# Patient Record
Sex: Male | Born: 1940 | Race: White | Hispanic: No | Marital: Married | Smoking: Former smoker
Health system: Southern US, Community
[De-identification: ages and names within clinical notes are randomized; demographics above are authoritative.]

## PROBLEM LIST (undated history)

## (undated) DIAGNOSIS — E119 Type 2 diabetes mellitus without complications: Secondary | ICD-10-CM

## (undated) DIAGNOSIS — F419 Anxiety disorder, unspecified: Secondary | ICD-10-CM

## (undated) DIAGNOSIS — E049 Nontoxic goiter, unspecified: Secondary | ICD-10-CM

## (undated) DIAGNOSIS — G8929 Other chronic pain: Secondary | ICD-10-CM

## (undated) DIAGNOSIS — R05 Cough: Secondary | ICD-10-CM

## (undated) DIAGNOSIS — I10 Essential (primary) hypertension: Secondary | ICD-10-CM

## (undated) DIAGNOSIS — M1990 Unspecified osteoarthritis, unspecified site: Secondary | ICD-10-CM

## (undated) DIAGNOSIS — G4730 Sleep apnea, unspecified: Secondary | ICD-10-CM

## (undated) DIAGNOSIS — N400 Enlarged prostate without lower urinary tract symptoms: Secondary | ICD-10-CM

## (undated) DIAGNOSIS — K219 Gastro-esophageal reflux disease without esophagitis: Secondary | ICD-10-CM

## (undated) DIAGNOSIS — E785 Hyperlipidemia, unspecified: Secondary | ICD-10-CM

## (undated) DIAGNOSIS — M549 Dorsalgia, unspecified: Secondary | ICD-10-CM

## (undated) DIAGNOSIS — H269 Unspecified cataract: Secondary | ICD-10-CM

## (undated) DIAGNOSIS — E039 Hypothyroidism, unspecified: Secondary | ICD-10-CM

## (undated) DIAGNOSIS — R059 Cough, unspecified: Secondary | ICD-10-CM

## (undated) HISTORY — PX: CARDIOVASCULAR STRESS TEST: SHX262

## (undated) HISTORY — PX: EYE SURGERY: SHX253

## (undated) HISTORY — PX: HERNIA REPAIR: SHX51

## (undated) HISTORY — DX: Cough: R05

## (undated) HISTORY — DX: Nontoxic goiter, unspecified: E04.9

## (undated) HISTORY — DX: Cough, unspecified: R05.9

---

## 1983-05-24 HISTORY — PX: CHOLECYSTECTOMY: SHX55

## 2001-05-23 HISTORY — PX: PROSTATE SURGERY: SHX751

## 2004-05-23 HISTORY — PX: ROTATOR CUFF REPAIR: SHX139

## 2012-06-18 ENCOUNTER — Other Ambulatory Visit: Payer: Self-pay | Admitting: Neurosurgery

## 2012-06-19 ENCOUNTER — Encounter (HOSPITAL_COMMUNITY): Payer: Self-pay | Admitting: Pharmacist

## 2012-06-22 ENCOUNTER — Ambulatory Visit (HOSPITAL_COMMUNITY)
Admission: RE | Admit: 2012-06-22 | Discharge: 2012-06-22 | Disposition: A | Discharge status: Home / Self Care | Payer: Medicare Other | Source: Ambulatory Visit | Attending: Anesthesiology | Admitting: Anesthesiology

## 2012-06-22 ENCOUNTER — Encounter (HOSPITAL_COMMUNITY): Payer: Self-pay | Admitting: Vascular Surgery

## 2012-06-22 ENCOUNTER — Encounter (HOSPITAL_COMMUNITY): Payer: Self-pay

## 2012-06-22 ENCOUNTER — Encounter (HOSPITAL_COMMUNITY)
Admission: RE | Admit: 2012-06-22 | Discharge: 2012-06-22 | Disposition: A | Discharge status: Home / Self Care | Payer: Medicare Other | Source: Ambulatory Visit | Attending: Neurosurgery | Admitting: Neurosurgery

## 2012-06-22 DIAGNOSIS — Z01810 Encounter for preprocedural cardiovascular examination: Secondary | ICD-10-CM | POA: Insufficient documentation

## 2012-06-22 DIAGNOSIS — Z01812 Encounter for preprocedural laboratory examination: Secondary | ICD-10-CM | POA: Insufficient documentation

## 2012-06-22 HISTORY — DX: Dorsalgia, unspecified: M54.9

## 2012-06-22 HISTORY — DX: Gastro-esophageal reflux disease without esophagitis: K21.9

## 2012-06-22 HISTORY — DX: Unspecified osteoarthritis, unspecified site: M19.90

## 2012-06-22 HISTORY — DX: Type 2 diabetes mellitus without complications: E11.9

## 2012-06-22 HISTORY — DX: Unspecified cataract: H26.9

## 2012-06-22 HISTORY — DX: Essential (primary) hypertension: I10

## 2012-06-22 HISTORY — DX: Sleep apnea, unspecified: G47.30

## 2012-06-22 HISTORY — DX: Benign prostatic hyperplasia without lower urinary tract symptoms: N40.0

## 2012-06-22 HISTORY — DX: Hyperlipidemia, unspecified: E78.5

## 2012-06-22 HISTORY — DX: Other chronic pain: G89.29

## 2012-06-22 LAB — CBC
HCT: 37.4 % — ABNORMAL LOW (ref 39.0–52.0)
Hemoglobin: 13 g/dL (ref 13.0–17.0)
MCH: 29.7 pg (ref 26.0–34.0)
MCHC: 34.8 g/dL (ref 30.0–36.0)
MCV: 85.6 fL (ref 78.0–100.0)
RDW: 12.4 % (ref 11.5–15.5)

## 2012-06-22 LAB — BASIC METABOLIC PANEL
BUN: 25 mg/dL — ABNORMAL HIGH (ref 6–23)
CO2: 28 mEq/L (ref 19–32)
Chloride: 104 mEq/L (ref 96–112)
Creatinine, Ser: 1.32 mg/dL (ref 0.50–1.35)
Glucose, Bld: 99 mg/dL (ref 70–99)
Potassium: 4.1 mEq/L (ref 3.5–5.1)

## 2012-06-22 LAB — SURGICAL PCR SCREEN: Staphylococcus aureus: NEGATIVE

## 2012-06-22 NOTE — Consult Note (Signed)
Anesthesia Note:  06/22/12 CXR report noted and reviewed with Anesthesiologist Dr. Katrinka Blazing.  Recommend further evaluation pre-operatively.  Darl Pikes at [facility] had Dr. Lovell Sheehan review.  Plan to cancel surgery until paratracheal mass versus goiter can be further evaluated.  Darl Pikes to contact patient and arrange plans for referral.    Shonna Chock, PA-C 06/22/12 1914

## 2012-06-22 NOTE — Pre-Procedure Instructions (Signed)
Edgar Chang  06/22/2012   Your procedure is scheduled on:  Monday June 25, 2012  Report to [facility] at 9:00 AM.  Call this number if you have problems the morning of surgery: [phone]   Remember:   Do not eat food or drink liquids after midnight.   Take these medicines the morning of surgery with A SIP OF WATER: omeprazole, flomax   Do not wear jewelry, make-up or nail polish.  Do not wear lotions, powders, or perfumes.  Do not shave 48 hours prior to surgery. Men may shave face and neck.  Do not bring valuables to the hospital.  Contacts, dentures or bridgework may not be worn into surgery.  Leave suitcase in the car. After surgery it may be brought to your room.  For patients admitted to the hospital, checkout time is 11:00 AM the day of  discharge.   Patients discharged the day of surgery will not be allowed to drive  home.  Name and phone number of your driver: family  / friend  Special Instructions: Shower using CHG 2 nights before surgery and the night before surgery.  If you shower the day of surgery use CHG.  Use special wash - you have one bottle of CHG for all showers.  You should use approximately 1/3 of the bottle for each shower.   Please read over the following fact sheets that you were given: Pain Booklet, Coughing and Deep Breathing, MRSA Information and Surgical Site Infection Prevention

## 2012-06-22 NOTE — Progress Notes (Signed)
Forwarded abnormal chest xray to anesthesia for review.

## 2012-06-25 ENCOUNTER — Inpatient Hospital Stay (HOSPITAL_COMMUNITY): Admission: RE | Admit: 2012-06-25 | Payer: Medicare Other | Source: Ambulatory Visit | Admitting: Neurosurgery

## 2012-06-25 SURGERY — LUMBAR LAMINECTOMY/DECOMPRESSION MICRODISCECTOMY 1 LEVEL
Anesthesia: General | Laterality: Left

## 2012-07-09 ENCOUNTER — Encounter (INDEPENDENT_AMBULATORY_CARE_PROVIDER_SITE_OTHER): Payer: Self-pay

## 2012-07-10 ENCOUNTER — Ambulatory Visit (INDEPENDENT_AMBULATORY_CARE_PROVIDER_SITE_OTHER): Payer: Medicare Other | Admitting: Surgery

## 2012-07-10 ENCOUNTER — Encounter (INDEPENDENT_AMBULATORY_CARE_PROVIDER_SITE_OTHER): Payer: Self-pay | Admitting: Surgery

## 2012-07-10 VITALS — BP 122/70 | HR 74 | Temp 98.1°F | Resp 18 | Ht 71.0 in | Wt 206.0 lb

## 2012-07-10 DIAGNOSIS — E049 Nontoxic goiter, unspecified: Secondary | ICD-10-CM

## 2012-07-10 NOTE — Patient Instructions (Signed)

## 2012-07-10 NOTE — Progress Notes (Signed)
General Surgery [facility]  Chief Complaint  Patient presents with  . New Evaluation    sub-sternal goiter with tracheal deviation - referral from Dr. 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 Patient had originally been diagnosed with thyroid goiter approximately 30 years ago. He underwent needle biopsy which by report was benign. Patient elected not to have surgical resection and has been relatively asymptomatic.  Patient recently has developed symptomatic lumbar disc disease. Preoperative evaluation included a chest x-ray showing tracheal deviation. Subsequent CT scan of the chest shows a bilobed enlarged mass in the right anterior mediastinum consistent with substernal goiter.  The largest component measures 6.2 cm in diameter. There is tracheal deviation to the left. There are calcifications in both the right and left thyroid lobes. Biopsy was recommended by the radiologist.  Patient has never been on thyroid medication. He has had no prior neck surgery. There is no family history of thyroid disease. There is no history of other endocrinopathy.  Past Medical History  Diagnosis Date  . Hypertension     sees Dr. Arma Heading  . Hyperlipidemia   . Sleep apnea     uses cpap occass.  . Diabetes mellitus without complication   . Benign prostate hyperplasia   . GERD (gastroesophageal reflux disease)   . Arthritis   . Cataract     left eye  . Chronic back pain   . Cough   . Goiter      Current Outpatient Prescriptions  Medication Sig Dispense Refill  . aspirin EC 81 MG tablet Take 81 mg by mouth daily.      Marland Kitchen glimepiride (AMARYL) 4 MG tablet Take 4 mg by mouth daily before breakfast.      . hydrochlorothiazide (HYDRODIURIL) 25 MG tablet Take 25 mg by mouth 2 (two) times daily.      Marland Kitchen ibuprofen (ADVIL,MOTRIN) 200 MG tablet Take 600 mg by mouth 2  (two) times daily as needed. For pain      . lisinopril (PRINIVIL,ZESTRIL) 20 MG tablet Take 20 mg by mouth daily.      . metFORMIN (GLUCOPHAGE) 500 MG tablet Take 500 mg by mouth 2 (two) times daily with a meal.      . Multiple Vitamin (MULTIVITAMIN WITH MINERALS) TABS Take 1 tablet by mouth daily.      Marland Kitchen omeprazole (PRILOSEC) 20 MG capsule Take 20 mg by mouth 2 (two) times daily.      . simvastatin (ZOCOR) 20 MG tablet Take 20 mg by mouth every evening.      . Tamsulosin HCl (FLOMAX) 0.4 MG CAPS Take 0.4 mg by mouth daily.       No current facility-administered medications for this visit.     No Known Allergies   Family History  Problem Relation Age of Onset  . Heart disease Mother      History   Social History  . Marital Status: Married    Spouse Name: N/A    Number of Children: N/A  . Years of Education: N/A   Social History Main Topics  . Smoking status: Former Smoker    Quit date: 07/10/1972  . Smokeless tobacco: None     Comment: stopped 20 years ago  . Alcohol Use: No  . Drug Use: No  . Sexually Active: None   Other Topics Concern  . None   Social History Narrative  .  None     REVIEW OF SYSTEMS - PERTINENT POSITIVES ONLY: Denies tremor. Denies palpitations. Patient does note chronic cough especially with prolonged talking. He notes occasional hoarseness. He denies dysphagia. He denies pain.  EXAM: Filed Vitals:   07/10/12 1100  BP: 122/70  Pulse: 74  Temp: 98.1 F (36.7 C)  Resp: 18    HEENT: normocephalic; pupils equal and reactive; sclerae clear; dentition good; mucous membranes moist NECK:  Palpation shows a slightly irregular left thyroid lobe without dominant or discrete mass; on the right, with the neck extended, and a swallowing maneuver, the top of the mass is palpable above the level of the clavicle; symmetric on extension; no palpable anterior or posterior cervical lymphadenopathy; no supraclavicular masses; no tenderness CHEST: clear to  auscultation bilaterally without rales, rhonchi, or wheezes CARDIAC: regular rate and rhythm without significant murmur; peripheral pulses are full EXT:  non-tender without edema; no deformity NEURO: no gross focal deficits; no sign of tremor   LABORATORY RESULTS: See [facility] (CHL-Epic) for most recent results   RADIOLOGY RESULTS: See [facility] (CHL-Epic) for most recent results   IMPRESSION: #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 We discussed further evaluation. I have recommended total thyroidectomy through a cervical incision. Risk and benefits of the procedure were discussed at length. Primary risk would be 2 recurrent laryngeal nerves. I explained to him that there was approximately a 5% chance of postoperative voice changes and hoarseness. I am also concerned about the size of the gland, itself sternal location, and the calcifications. There is a probable risk of malignancy of about 15%.  After discussion the patient and his wife have decided to proceed with thyroidectomy. We will make arrangements for surgery in the near future.  The risks and benefits of the procedure have been discussed at length with the patient.  The patient understands the proposed procedure, potential alternative treatments, and the course of recovery to be expected.  All of the patient's questions have been answered at this time.  The patient wishes to proceed with surgery.  Velora Heckler, MD, FACS General & Endocrine Surgery [facility]   Visit Diagnoses: 1. Substernal thyroid goiter     Primary Care Physician: No primary provider on file.

## 2012-07-11 ENCOUNTER — Encounter (HOSPITAL_COMMUNITY): Payer: Self-pay | Admitting: Pharmacy Technician

## 2012-07-13 ENCOUNTER — Encounter (HOSPITAL_COMMUNITY)
Admission: RE | Admit: 2012-07-13 | Discharge: 2012-07-13 | Disposition: A | Discharge status: Home / Self Care | Payer: Medicare Other | Source: Ambulatory Visit | Attending: Surgery | Admitting: Surgery

## 2012-07-13 ENCOUNTER — Encounter (HOSPITAL_COMMUNITY): Payer: Self-pay

## 2012-07-13 ENCOUNTER — Ambulatory Visit (HOSPITAL_COMMUNITY)
Admission: RE | Admit: 2012-07-13 | Discharge: 2012-07-13 | Disposition: A | Discharge status: Home / Self Care | Payer: Medicare Other | Source: Ambulatory Visit | Attending: Anesthesiology | Admitting: Anesthesiology

## 2012-07-13 DIAGNOSIS — Z01818 Encounter for other preprocedural examination: Secondary | ICD-10-CM | POA: Insufficient documentation

## 2012-07-13 DIAGNOSIS — E049 Nontoxic goiter, unspecified: Secondary | ICD-10-CM | POA: Insufficient documentation

## 2012-07-13 DIAGNOSIS — Z01812 Encounter for preprocedural laboratory examination: Secondary | ICD-10-CM | POA: Insufficient documentation

## 2012-07-13 LAB — SURGICAL PCR SCREEN
MRSA, PCR: NEGATIVE
Staphylococcus aureus: NEGATIVE

## 2012-07-13 LAB — BASIC METABOLIC PANEL
GFR calc Af Amer: 78 mL/min — ABNORMAL LOW (ref >90)
GFR calc non Af Amer: 67 mL/min — ABNORMAL LOW (ref >90)
Potassium: 4 mEq/L (ref 3.5–5.1)
Sodium: 139 mEq/L (ref 135–145)

## 2012-07-13 LAB — CBC
MCHC: 34.3 g/dL (ref 30.0–36.0)
Platelets: 237 10*3/uL (ref 150–400)
RDW: 12.1 % (ref 11.5–15.5)
WBC: 5.3 10*3/uL (ref 4.0–10.5)

## 2012-07-13 NOTE — Progress Notes (Signed)
Quick Note:  These results are acceptable for scheduled surgery.  Edgar Woon M. Hadlie Gipson, MD, FACS [facility] Office: [phone]   ______ 

## 2012-07-13 NOTE — Progress Notes (Signed)
Dr Rica Mast made aware of neck Xray results.  Report taken to Dr Rica Mast.  No new orders given.  Dr Rica Mast noted images in [facility].

## 2012-07-13 NOTE — Patient Instructions (Signed)
Edgar Chang  07/13/2012   Your procedure is scheduled on:  07/24/12   Report to [facility] at   0730  AM.  Call this number if you have problems the morning of surgery: [phone]   Remember:   Do not eat food or drink liquids after midnight.   Take these medicines the morning of surgery with A SIP OF WATER:    Do not wear jewelry,   Do not wear lotions, powders, or perfumes.   . Men may shave face and neck.  Do not bring valuables to the hospital.  Contacts, dentures or bridgework may not be worn into surgery.  Leave suitcase in the car. After surgery it may be brought to your room.  For patients admitted to the hospital, checkout time is 11:00 AM the day of  discharge.      SEE CHG INSTRUCTION SHEET    Please read over the following fact sheets that you were given: MRSA Information, coughing and deep breathing exercises, leg exercises               Failure to comply with these instructions may result in cancellation of your surgery.                Patient Signature ____________________________              Nurse Signature _____________________________

## 2012-07-13 NOTE — Progress Notes (Signed)
Dr Rica Mast in to do anesthesia consult prior to surgery.  DG soft tissue of neck xray was ordered per Dr Rica Mast.  Preop nurse to Xray and informed Radiology Tech what Dr Rica Mast wanted ordered and reason.  She informed me to order DG soft tissue of neck.  Placed into EPIC.    Patient taken to Xray at end of preop visit.

## 2012-07-13 NOTE — Anesthesia Preprocedure Evaluation (Addendum)
Anesthesia Evaluation  Patient identified by MRN, date of birth, ID band Patient awake    Reviewed: Allergy & Precautions, H&P , NPO status , Patient's Chart, lab work & pertinent test results  History of Anesthesia Complications (+) DIFFICULT AIRWAY  Airway Mallampati: III TM Distance: <3 FB Neck ROM: Full Positive for:  Tracheal deviation Mouth opening: Limited Mouth Opening  Dental  (+) Teeth Intact and Dental Advisory Given   Pulmonary sleep apnea and Continuous Positive Airway Pressure Ventilation ,  breath sounds clear to auscultation  Pulmonary exam normal       Cardiovascular hypertension, Pt. on medications Rhythm:Regular Rate:Normal     Neuro/Psych Low back pain; scheduled for surgery after completion of thyroid surgery. negative neurological ROS  negative psych ROS   GI/Hepatic negative GI ROS, Neg liver ROS, GERD-  ,  Endo/Other  negative endocrine ROSdiabetes, Type 2, Oral Hypoglycemic Agents  Renal/GU negative Renal ROS  negative genitourinary   Musculoskeletal negative musculoskeletal ROS (+)   Abdominal   Peds  Hematology negative hematology ROS (+)   Anesthesia Other Findings History of bilateral goiter with left tracheal deviation. Patient denies chocking, or difficulty swallowing. Patient scheduled for soft tissue x-ray for evaluation of tracheal deviation.  Reproductive/Obstetrics negative OB ROS                          Anesthesia Physical Anesthesia Plan  ASA: II  Anesthesia Plan: General   Post-op Pain Management:    Induction: Intravenous  Airway Management Planned: Oral ETT, Fiberoptic Intubation Planned and Awake Intubation Planned  Additional Equipment:   Intra-op Plan:   Post-operative Plan: Possible Post-op intubation/ventilation  Informed Consent: I have reviewed the patients History and Physical, chart, labs and discussed the procedure including the  risks, benefits and alternatives for the proposed anesthesia with the patient or authorized representative who has indicated his/her understanding and acceptance.   Dental advisory given  Plan Discussed with: CRNA and Surgeon  Anesthesia Plan Comments: (May require awake fiberoptic intubation. X-ray of soft tissue of neck pending. )      Anesthesia Quick Evaluation

## 2012-07-24 ENCOUNTER — Ambulatory Visit (HOSPITAL_COMMUNITY): Payer: Medicare Other | Admitting: Anesthesiology

## 2012-07-24 ENCOUNTER — Encounter (HOSPITAL_COMMUNITY)
Admission: RE | Disposition: A | Discharge status: Home / Self Care | Payer: Self-pay | Source: Ambulatory Visit | Attending: Surgery

## 2012-07-24 ENCOUNTER — Encounter (HOSPITAL_COMMUNITY): Payer: Self-pay | Admitting: *Deleted

## 2012-07-24 ENCOUNTER — Observation Stay (HOSPITAL_COMMUNITY)
Admission: RE | Admit: 2012-07-24 | Discharge: 2012-07-25 | Disposition: A | Discharge status: Home / Self Care | Payer: Medicare Other | Source: Ambulatory Visit | Attending: Surgery | Admitting: Surgery

## 2012-07-24 ENCOUNTER — Encounter (HOSPITAL_COMMUNITY): Payer: Self-pay | Admitting: Anesthesiology

## 2012-07-24 DIAGNOSIS — E049 Nontoxic goiter, unspecified: Principal | ICD-10-CM | POA: Diagnosis present

## 2012-07-24 DIAGNOSIS — K219 Gastro-esophageal reflux disease without esophagitis: Secondary | ICD-10-CM | POA: Insufficient documentation

## 2012-07-24 DIAGNOSIS — J398 Other specified diseases of upper respiratory tract: Secondary | ICD-10-CM | POA: Insufficient documentation

## 2012-07-24 DIAGNOSIS — N400 Enlarged prostate without lower urinary tract symptoms: Secondary | ICD-10-CM | POA: Insufficient documentation

## 2012-07-24 DIAGNOSIS — G4730 Sleep apnea, unspecified: Secondary | ICD-10-CM | POA: Insufficient documentation

## 2012-07-24 DIAGNOSIS — E785 Hyperlipidemia, unspecified: Secondary | ICD-10-CM | POA: Insufficient documentation

## 2012-07-24 DIAGNOSIS — Z79899 Other long term (current) drug therapy: Secondary | ICD-10-CM | POA: Insufficient documentation

## 2012-07-24 DIAGNOSIS — I10 Essential (primary) hypertension: Secondary | ICD-10-CM | POA: Insufficient documentation

## 2012-07-24 DIAGNOSIS — E119 Type 2 diabetes mellitus without complications: Secondary | ICD-10-CM | POA: Insufficient documentation

## 2012-07-24 DIAGNOSIS — Z7982 Long term (current) use of aspirin: Secondary | ICD-10-CM | POA: Insufficient documentation

## 2012-07-24 HISTORY — PX: THYROIDECTOMY: SHX17

## 2012-07-24 LAB — GLUCOSE, CAPILLARY
Glucose-Capillary: 141 mg/dL — ABNORMAL HIGH (ref 70–99)
Glucose-Capillary: 218 mg/dL — ABNORMAL HIGH (ref 70–99)
Glucose-Capillary: 225 mg/dL — ABNORMAL HIGH (ref 70–99)

## 2012-07-24 SURGERY — THYROIDECTOMY
Anesthesia: General | Site: Neck | Wound class: Clean

## 2012-07-24 MED ORDER — PANTOPRAZOLE SODIUM 40 MG PO TBEC
40.0000 mg | DELAYED_RELEASE_TABLET | Freq: Every day | ORAL | Status: DC
  Administered 2012-07-24: 40 mg via ORAL
  Filled 2012-07-24 (×2): qty 1

## 2012-07-24 MED ORDER — 0.9 % SODIUM CHLORIDE (POUR BTL) OPTIME
TOPICAL | Status: DC | PRN
  Administered 2012-07-24: 1000 mL

## 2012-07-24 MED ORDER — KCL IN DEXTROSE-NACL 20-5-0.45 MEQ/L-%-% IV SOLN
INTRAVENOUS | Status: DC
  Administered 2012-07-24: 50 mL/h via INTRAVENOUS
  Filled 2012-07-24 (×2): qty 1000

## 2012-07-24 MED ORDER — DEXMEDETOMIDINE HCL IN NACL 200 MCG/50ML IV SOLN
0.4000 ug/kg/h | INTRAVENOUS | Status: DC
  Administered 2012-07-24: 0.6 ug/kg/h via INTRAVENOUS
  Filled 2012-07-24: qty 50

## 2012-07-24 MED ORDER — ONDANSETRON HCL 4 MG/2ML IJ SOLN
INTRAMUSCULAR | Status: DC | PRN
  Administered 2012-07-24: 4 mg via INTRAVENOUS

## 2012-07-24 MED ORDER — FENTANYL CITRATE 0.05 MG/ML IJ SOLN
INTRAMUSCULAR | Status: DC | PRN
  Administered 2012-07-24: 50 ug via INTRAVENOUS
  Administered 2012-07-24 (×2): 25 ug via INTRAVENOUS

## 2012-07-24 MED ORDER — ROCURONIUM BROMIDE 100 MG/10ML IV SOLN
INTRAVENOUS | Status: DC | PRN
  Administered 2012-07-24: 20 mg via INTRAVENOUS

## 2012-07-24 MED ORDER — LACTATED RINGERS IV SOLN
INTRAVENOUS | Status: DC | PRN
  Administered 2012-07-24: 09:00:00 via INTRAVENOUS

## 2012-07-24 MED ORDER — METFORMIN HCL 500 MG PO TABS
500.0000 mg | ORAL_TABLET | Freq: Two times a day (BID) | ORAL | Status: DC
  Administered 2012-07-24 – 2012-07-25 (×2): 500 mg via ORAL
  Filled 2012-07-24 (×4): qty 1

## 2012-07-24 MED ORDER — NEOSTIGMINE METHYLSULFATE 1 MG/ML IJ SOLN
INTRAMUSCULAR | Status: DC | PRN
  Administered 2012-07-24: 5 mg via INTRAVENOUS

## 2012-07-24 MED ORDER — ACETAMINOPHEN 325 MG PO TABS: 650.0000 mg | ORAL_TABLET | ORAL | Status: DC | PRN

## 2012-07-24 MED ORDER — CEFAZOLIN SODIUM-DEXTROSE 2-3 GM-% IV SOLR
INTRAVENOUS | Status: DC | PRN
  Administered 2012-07-24: 2 g via INTRAVENOUS

## 2012-07-24 MED ORDER — GLYCOPYRROLATE 0.2 MG/ML IJ SOLN
INTRAMUSCULAR | Status: DC | PRN
  Administered 2012-07-24: 0.6 mg via INTRAVENOUS

## 2012-07-24 MED ORDER — LACTATED RINGERS IV SOLN
INTRAVENOUS | Status: DC | PRN
  Administered 2012-07-24 (×2): via INTRAVENOUS

## 2012-07-24 MED ORDER — HYDROMORPHONE HCL PF 1 MG/ML IJ SOLN
1.0000 mg | INTRAMUSCULAR | Status: DC | PRN
  Administered 2012-07-24 – 2012-07-25 (×5): 1 mg via INTRAVENOUS
  Filled 2012-07-24 (×5): qty 1

## 2012-07-24 MED ORDER — HYDROMORPHONE HCL PF 1 MG/ML IJ SOLN
INTRAMUSCULAR | Status: DC | PRN
  Administered 2012-07-24: 2 mg via INTRAVENOUS

## 2012-07-24 MED ORDER — DEXMEDETOMIDINE HCL IN NACL 200 MCG/50ML IV SOLN: 0.4000 ug/kg/h | INTRAVENOUS | Status: DC

## 2012-07-24 MED ORDER — GLIMEPIRIDE 4 MG PO TABS
4.0000 mg | ORAL_TABLET | Freq: Every day | ORAL | Status: DC
  Administered 2012-07-25: 4 mg via ORAL
  Filled 2012-07-24 (×2): qty 1

## 2012-07-24 MED ORDER — INSULIN ASPART 100 UNIT/ML ~~LOC~~ SOLN
0.0000 [IU] | SUBCUTANEOUS | Status: DC
  Administered 2012-07-24: 5 [IU] via SUBCUTANEOUS
  Administered 2012-07-25 (×2): 2 [IU] via SUBCUTANEOUS
  Administered 2012-07-25: 3 [IU] via SUBCUTANEOUS

## 2012-07-24 MED ORDER — CEFAZOLIN SODIUM-DEXTROSE 2-3 GM-% IV SOLR: 2.0000 g | INTRAVENOUS | Status: DC

## 2012-07-24 MED ORDER — HYDROMORPHONE HCL PF 1 MG/ML IJ SOLN: 0.2500 mg | INTRAMUSCULAR | Status: DC | PRN

## 2012-07-24 MED ORDER — ACETAMINOPHEN 10 MG/ML IV SOLN
INTRAVENOUS | Status: DC | PRN
  Administered 2012-07-24: 1000 mg via INTRAVENOUS

## 2012-07-24 MED ORDER — ACETAMINOPHEN 10 MG/ML IV SOLN
INTRAVENOUS | Status: AC
  Filled 2012-07-24: qty 100

## 2012-07-24 MED ORDER — CEFAZOLIN SODIUM-DEXTROSE 2-3 GM-% IV SOLR
INTRAVENOUS | Status: AC
  Filled 2012-07-24: qty 50

## 2012-07-24 MED ORDER — PROPOFOL 10 MG/ML IV BOLUS
INTRAVENOUS | Status: DC | PRN
  Administered 2012-07-24: 100 mg via INTRAVENOUS

## 2012-07-24 MED ORDER — PROMETHAZINE HCL 25 MG/ML IJ SOLN: 6.2500 mg | INTRAMUSCULAR | Status: DC | PRN

## 2012-07-24 MED ORDER — ALPRAZOLAM 0.5 MG PO TABS
0.5000 mg | ORAL_TABLET | Freq: Two times a day (BID) | ORAL | Status: DC | PRN
  Administered 2012-07-24 – 2012-07-25 (×2): 0.5 mg via ORAL
  Filled 2012-07-24 (×3): qty 1

## 2012-07-24 MED ORDER — CALCIUM CARBONATE 1250 (500 CA) MG PO TABS
1250.0000 mg | ORAL_TABLET | Freq: Three times a day (TID) | ORAL | Status: DC
  Administered 2012-07-24 – 2012-07-25 (×2): 1250 mg via ORAL
  Filled 2012-07-24 (×5): qty 1

## 2012-07-24 MED ORDER — TAMSULOSIN HCL 0.4 MG PO CAPS
0.4000 mg | ORAL_CAPSULE | Freq: Every day | ORAL | Status: DC
  Administered 2012-07-24 – 2012-07-25 (×2): 0.4 mg via ORAL
  Filled 2012-07-24 (×2): qty 1

## 2012-07-24 MED ORDER — LISINOPRIL 20 MG PO TABS
20.0000 mg | ORAL_TABLET | Freq: Every day | ORAL | Status: DC
  Administered 2012-07-24 – 2012-07-25 (×2): 20 mg via ORAL
  Filled 2012-07-24 (×3): qty 1

## 2012-07-24 MED ORDER — HYDROCODONE-ACETAMINOPHEN 5-325 MG PO TABS
1.0000 | ORAL_TABLET | ORAL | Status: DC | PRN
  Administered 2012-07-25: 2 via ORAL
  Filled 2012-07-24 (×2): qty 2

## 2012-07-24 MED ORDER — PROMETHAZINE HCL 25 MG/ML IJ SOLN: 12.5000 mg | INTRAMUSCULAR | Status: DC | PRN

## 2012-07-24 MED ORDER — PHENYLEPHRINE HCL 10 MG/ML IJ SOLN
INTRAMUSCULAR | Status: DC | PRN
  Administered 2012-07-24: 80 ug via INTRAVENOUS
  Administered 2012-07-24: 40 ug via INTRAVENOUS
  Administered 2012-07-24 (×2): 80 ug via INTRAVENOUS
  Administered 2012-07-24: 120 ug via INTRAVENOUS

## 2012-07-24 MED ORDER — DEXAMETHASONE SODIUM PHOSPHATE 4 MG/ML IJ SOLN
INTRAMUSCULAR | Status: DC | PRN
  Administered 2012-07-24: 10 mg via INTRAVENOUS

## 2012-07-24 MED ORDER — MIDAZOLAM HCL 5 MG/5ML IJ SOLN
INTRAMUSCULAR | Status: DC | PRN
  Administered 2012-07-24 (×2): 1 mg via INTRAVENOUS
  Administered 2012-07-24: 2 mg via INTRAVENOUS
  Administered 2012-07-24: 1 mg via INTRAVENOUS

## 2012-07-24 MED ORDER — METOCLOPRAMIDE HCL 5 MG/ML IJ SOLN
INTRAMUSCULAR | Status: DC | PRN
  Administered 2012-07-24: 10 mg via INTRAVENOUS

## 2012-07-24 SURGICAL SUPPLY — 40 items
ATTRACTOMAT 16X20 MAGNETIC DRP (DRAPES) ×2 IMPLANT
BENZOIN TINCTURE PRP APPL 2/3 (GAUZE/BANDAGES/DRESSINGS) ×2 IMPLANT
BLADE HEX COATED 2.75 (ELECTRODE) ×2 IMPLANT
BLADE SURG 15 STRL LF DISP TIS (BLADE) ×1 IMPLANT
BLADE SURG 15 STRL SS (BLADE) ×1
CANISTER SUCTION 2500CC (MISCELLANEOUS) ×2 IMPLANT
CHLORAPREP W/TINT 10.5 ML (MISCELLANEOUS) ×2 IMPLANT
CLIP TI MEDIUM 6 (CLIP) ×12 IMPLANT
CLIP TI WIDE RED SMALL 6 (CLIP) ×8 IMPLANT
CLOTH BEACON ORANGE TIMEOUT ST (SAFETY) ×2 IMPLANT
CLSR STERI-STRIP ANTIMIC 1/2X4 (GAUZE/BANDAGES/DRESSINGS) ×2 IMPLANT
DISSECTOR ROUND CHERRY 3/8 STR (MISCELLANEOUS) ×2 IMPLANT
DRAPE PED LAPAROTOMY (DRAPES) ×2 IMPLANT
DRESSING SURGICEL FIBRLLR 1X2 (HEMOSTASIS) ×1 IMPLANT
DRSG SURGICEL FIBRILLAR 1X2 (HEMOSTASIS) ×2
ELECT REM PT RETURN 9FT ADLT (ELECTROSURGICAL) ×2
ELECTRODE REM PT RTRN 9FT ADLT (ELECTROSURGICAL) ×1 IMPLANT
GAUZE SPONGE 4X4 16PLY XRAY LF (GAUZE/BANDAGES/DRESSINGS) ×4 IMPLANT
GLOVE SURG ORTHO 8.0 STRL STRW (GLOVE) ×2 IMPLANT
GOWN STRL NON-REIN LRG LVL3 (GOWN DISPOSABLE) IMPLANT
GOWN STRL REIN XL XLG (GOWN DISPOSABLE) ×6 IMPLANT
KIT BASIN OR (CUSTOM PROCEDURE TRAY) ×2 IMPLANT
NS IRRIG 1000ML POUR BTL (IV SOLUTION) ×2 IMPLANT
PACK BASIC VI WITH GOWN DISP (CUSTOM PROCEDURE TRAY) ×2 IMPLANT
PENCIL BUTTON HOLSTER BLD 10FT (ELECTRODE) ×2 IMPLANT
SHEARS HARMONIC 9CM CVD (BLADE) ×2 IMPLANT
SPONGE GAUZE 4X4 12PLY (GAUZE/BANDAGES/DRESSINGS) ×2 IMPLANT
STAPLER VISISTAT 35W (STAPLE) ×2 IMPLANT
STRIP CLOSURE SKIN 1/2X4 (GAUZE/BANDAGES/DRESSINGS) ×2 IMPLANT
SUT MNCRL AB 4-0 PS2 18 (SUTURE) ×2 IMPLANT
SUT SILK 2 0 (SUTURE) ×1
SUT SILK 2-0 18XBRD TIE 12 (SUTURE) ×1 IMPLANT
SUT SILK 3 0 (SUTURE)
SUT SILK 3-0 18XBRD TIE 12 (SUTURE) IMPLANT
SUT VIC AB 3-0 SH 18 (SUTURE) ×2 IMPLANT
SYR BULB IRRIGATION 50ML (SYRINGE) ×2 IMPLANT
TAPE CLOTH SURG 4X10 WHT LF (GAUZE/BANDAGES/DRESSINGS) ×2 IMPLANT
TOWEL OR 17X26 10 PK STRL BLUE (TOWEL DISPOSABLE) ×2 IMPLANT
TOWEL OR NON WOVEN STRL DISP B (DISPOSABLE) ×2 IMPLANT
YANKAUER SUCT BULB TIP 10FT TU (MISCELLANEOUS) ×2 IMPLANT

## 2012-07-24 NOTE — Interval H&P Note (Signed)
History and Physical Interval Note:  07/24/2012 9:34 AM  Edgar Chang  has presented today for surgery, with the diagnosis of substernal thyroid goiter with tracheal deviation.  The various methods of treatment have been discussed with the patient and family. After consideration of risks, benefits and other options for treatment, the patient has consented to    Procedure(s) with comments:  Total THYROIDECTOMY (N/A) - Difficult Airway as a surgical intervention .    The patient's history has been reviewed, patient examined, no change in status, stable for surgery.  I have reviewed the patient's chart and labs.  Questions were answered to the patient's satisfaction.    Velora Heckler, MD, FACS General & Endocrine Surgery [facility] Office: [phone]   GERKIN,TODD Judie Petit

## 2012-07-24 NOTE — Brief Op Note (Signed)
07/24/2012  12:12 PM  PATIENT:  Edgar Chang  72 y.o. male  PRE-OPERATIVE DIAGNOSIS:  substernal thyroid goiter with tracheal deviation  POST-OPERATIVE DIAGNOSIS:  substernal thyroid goiter with tracheal deviation  PROCEDURE:  Procedure(s) with comments:  Total THYROIDECTOMY (N/A) - Difficult Airway  SURGEON:  Surgeon(s) and Role:    * Velora Heckler, MD - Primary  ANESTHESIA:   general  EBL:  Total I/O In: 1000 [I.V.:1000] Out: -   BLOOD ADMINISTERED:none  DRAINS: none   LOCAL MEDICATIONS USED:  NONE  SPECIMEN:  Excision  DISPOSITION OF SPECIMEN:  PATHOLOGY  COUNTS:  YES  TOURNIQUET:  * No tourniquets in log *  DICTATION: .Other Dictation: Dictation Number [id number]  PLAN OF CARE: Admit for overnight observation  PATIENT DISPOSITION:  PACU - hemodynamically stable.   Delay start of Pharmacological VTE agent (>24hrs) due to surgical blood loss or risk of bleeding: yes  Velora Heckler, MD, FACS General & Endocrine Surgery [facility] Office: [phone]

## 2012-07-24 NOTE — H&P (View-Only) (Signed)
General Surgery - [facility]  Chief Complaint  Patient presents with  . New Evaluation    sub-sternal goiter with tracheal deviation - referral from Dr. 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 Patient had originally been diagnosed with thyroid goiter approximately 30 years ago. He underwent needle biopsy which by report was benign. Patient elected not to have surgical resection and has been relatively asymptomatic.  Patient recently has developed symptomatic lumbar disc disease. Preoperative evaluation included a chest x-ray showing tracheal deviation. Subsequent CT scan of the chest shows a bilobed enlarged mass in the right anterior mediastinum consistent with substernal goiter.  The largest component measures 6.2 cm in diameter. There is tracheal deviation to the left. There are calcifications in both the right and left thyroid lobes. Biopsy was recommended by the radiologist.  Patient has never been on thyroid medication. He has had no prior neck surgery. There is no family history of thyroid disease. There is no history of other endocrinopathy.  Past Medical History  Diagnosis Date  . Hypertension     sees Dr. james Isernia  . Hyperlipidemia   . Sleep apnea     uses cpap occass.  . Diabetes mellitus without complication   . Benign prostate hyperplasia   . GERD (gastroesophageal reflux disease)   . Arthritis   . Cataract     left eye  . Chronic back pain   . Cough   . Goiter      Current Outpatient Prescriptions  Medication Sig Dispense Refill  . aspirin EC 81 MG tablet Take 81 mg by mouth daily.      . glimepiride (AMARYL) 4 MG tablet Take 4 mg by mouth daily before breakfast.      . hydrochlorothiazide (HYDRODIURIL) 25 MG tablet Take 25 mg by mouth 2 (two) times daily.      . ibuprofen (ADVIL,MOTRIN) 200 MG tablet Take 600 mg by mouth 2  (two) times daily as needed. For pain      . lisinopril (PRINIVIL,ZESTRIL) 20 MG tablet Take 20 mg by mouth daily.      . metFORMIN (GLUCOPHAGE) 500 MG tablet Take 500 mg by mouth 2 (two) times daily with a meal.      . Multiple Vitamin (MULTIVITAMIN WITH MINERALS) TABS Take 1 tablet by mouth daily.      . omeprazole (PRILOSEC) 20 MG capsule Take 20 mg by mouth 2 (two) times daily.      . simvastatin (ZOCOR) 20 MG tablet Take 20 mg by mouth every evening.      . Tamsulosin HCl (FLOMAX) 0.4 MG CAPS Take 0.4 mg by mouth daily.       No current facility-administered medications for this visit.     No Known Allergies   Family History  Problem Relation Age of Onset  . Heart disease Mother      History   Social History  . Marital Status: Married    Spouse Name: N/A    Number of Children: N/A  . Years of Education: N/A   Social History Main Topics  . Smoking status: Former Smoker    Quit date: 07/10/1972  . Smokeless tobacco: None     Comment: stopped 20 years ago  . Alcohol Use: No  . Drug Use: No  . Sexually Active: None   Other Topics Concern  . None   Social History Narrative  .   None     REVIEW OF SYSTEMS - PERTINENT POSITIVES ONLY: Denies tremor. Denies palpitations. Patient does note chronic cough especially with prolonged talking. He notes occasional hoarseness. He denies dysphagia. He denies pain.  EXAM: Filed Vitals:   07/10/12 1100  BP: 122/70  Pulse: 74  Temp: 98.1 F (36.7 C)  Resp: 18    HEENT: normocephalic; pupils equal and reactive; sclerae clear; dentition good; mucous membranes moist NECK:  Palpation shows a slightly irregular left thyroid lobe without dominant or discrete mass; on the right, with the neck extended, and a swallowing maneuver, the top of the mass is palpable above the level of the clavicle; symmetric on extension; no palpable anterior or posterior cervical lymphadenopathy; no supraclavicular masses; no tenderness CHEST: clear to  auscultation bilaterally without rales, rhonchi, or wheezes CARDIAC: regular rate and rhythm without significant murmur; peripheral pulses are full EXT:  non-tender without edema; no deformity NEURO: no gross focal deficits; no sign of tremor   LABORATORY RESULTS: See [facility] (CHL-Epic) for most recent results   RADIOLOGY RESULTS: See [facility] (CHL-Epic) for most recent results   IMPRESSION: #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 We discussed further evaluation. I have recommended total thyroidectomy through a cervical incision. Risk and benefits of the procedure were discussed at length. Primary risk would be 2 recurrent laryngeal nerves. I explained to him that there was approximately a 5% chance of postoperative voice changes and hoarseness. I am also concerned about the size of the gland, itself sternal location, and the calcifications. There is a probable risk of malignancy of about 15%.  After discussion the patient and his wife have decided to proceed with thyroidectomy. We will make arrangements for surgery in the near future.  The risks and benefits of the procedure have been discussed at length with the patient.  The patient understands the proposed procedure, potential alternative treatments, and the course of recovery to be expected.  All of the patient's questions have been answered at this time.  The patient wishes to proceed with surgery.  Chimaobi Casebolt M. Enzley Kitchens, MD, FACS General & Endocrine Surgery [facility]   Visit Diagnoses: 1. Substernal thyroid goiter     Primary Care Physician: No primary provider on file.   

## 2012-07-24 NOTE — Anesthesia Postprocedure Evaluation (Signed)
  Anesthesia Post-op Note  Patient: Edgar Chang  Procedure(s) Performed: Procedure(s) (LRB):  Total THYROIDECTOMY (N/A)  Patient Location: PACU  Anesthesia Type: General  Level of Consciousness: awake and alert   Airway and Oxygen Therapy: Patient Spontanous Breathing  Post-op Pain: mild  Post-op Assessment: Post-op Vital signs reviewed, Patient's Cardiovascular Status Stable, Respiratory Function Stable, Patent Airway and No signs of Nausea or vomiting  Last Vitals:  Filed Vitals:   07/24/12 1245  BP: 120/59  Pulse: 61  Temp:   Resp: 13    Post-op Vital Signs: stable   Complications: No apparent anesthesia complications

## 2012-07-24 NOTE — Transfer of Care (Signed)
Immediate Anesthesia Transfer of Care Note  Patient: Edgar Chang  Procedure(s) Performed: Procedure(s) with comments:  Total THYROIDECTOMY (N/A) - Difficult Airway  Patient Location: PACU  Anesthesia Type:General  Level of Consciousness: awake, patient cooperative and responds to stimulation, drowsy, ventilating well  Airway & Oxygen Therapy: Patient Spontanous Breathing and Patient connected to face mask oxygen  Post-op Assessment: Report given to PACU RN, Post -op Vital signs reviewed and stable and Patient moving all extremities X 4  Post vital signs: Reviewed and stable  Complications: No apparent anesthesia complications

## 2012-07-24 NOTE — Preoperative (Signed)
Beta Blockers   Reason not to administer Beta Blockers:Not Applicable, not on home BB 

## 2012-07-24 NOTE — Progress Notes (Signed)
Patient accepted to 5 east post op.  Patient is arousable and oriented x4. Patient does not complain of any pain at this time.  Ice pack to neck.  Dressing to neck dry and intact.  Taught patient how to use incentive spirometer.  Patient demonstrated use of incentive spirometer.  Family at bedside.  Iv fluids infusing at this time.  Patient tsking sips of clears without any difficulty.  Will continue to monitor.

## 2012-07-24 NOTE — Anesthesia Procedure Notes (Signed)
Procedure Name: Awake intubation Performed by: ROSEGreggory Chang Pre-anesthesia Checklist: Patient identified, Emergency Drugs available, Suction available and Patient being monitored Patient Re-evaluated:Patient Re-evaluated prior to inductionOxygen Delivery Method: Nasal cannula Number of attempts: 1 Airway Equipment and Method: Fiberoptic brochoscope Placement Confirmation: ETT inserted through vocal cords under direct vision,  positive ETCO2 and breath sounds checked- equal and bilateral Secured at: 21 cm Dental Injury: Teeth and Oropharynx as per pre-operative assessment  Difficulty Due To: Difficulty was anticipated, Difficult Airway- due to reduced neck mobility and Difficult Airway- due to anterior larynx Comments: Large goiter.  Total of 10cc of 1.0% lidocaine used preoperatively to anesthetize the airway. Transtracheal injection performed as well. Patient tolerated AFOI well, no apparent dental injury

## 2012-07-24 NOTE — Progress Notes (Signed)
Patient's blood sugar at 1700 218.  Patient given metformin.  Called placed to surgery Dr. Daphine Deutscher.  Sliding scale and cbg ordered.  Ordered to recheck blood sugar in 4 hours.  Will monitor.

## 2012-07-25 ENCOUNTER — Encounter (HOSPITAL_COMMUNITY): Payer: Self-pay | Admitting: Surgery

## 2012-07-25 LAB — BASIC METABOLIC PANEL
Calcium: 9.2 mg/dL (ref 8.4–10.5)
GFR calc non Af Amer: 72 mL/min — ABNORMAL LOW (ref >90)
Sodium: 139 mEq/L (ref 135–145)

## 2012-07-25 LAB — GLUCOSE, CAPILLARY
Glucose-Capillary: 136 mg/dL — ABNORMAL HIGH (ref 70–99)
Glucose-Capillary: 96 mg/dL (ref 70–99)

## 2012-07-25 MED ORDER — HYDROCODONE-ACETAMINOPHEN 5-325 MG PO TABS: 1.0000 | ORAL_TABLET | ORAL | Status: DC | PRN

## 2012-07-25 MED ORDER — ALPRAZOLAM 0.5 MG PO TABS: 0.5000 mg | ORAL_TABLET | Freq: Two times a day (BID) | ORAL | Status: DC | PRN

## 2012-07-25 MED ORDER — CALCIUM CARBONATE 1250 (500 CA) MG PO CAPS: 1250.0000 mg | ORAL_CAPSULE | Freq: Two times a day (BID) | ORAL | Status: DC

## 2012-07-25 MED ORDER — SYNTHROID 100 MCG PO TABS: 100.0000 ug | ORAL_TABLET | Freq: Every day | ORAL | Status: DC

## 2012-07-25 NOTE — Discharge Summary (Signed)
Physician Discharge Summary [facility]  Patient ID: Edgar Chang MRN: [id number] DOB/AGE: 74-Apr-1942 74 y.o.  Admit date: 07/24/2012 Discharge date: 07/25/2012  Admission Diagnoses:  Substernal thyroid goiter  Discharge Diagnoses:  Principal Problem:   Substernal thyroid goiter   Discharged Condition: good  Hospital Course: patient admitted for observation after total thyroidectomy.  Post op course uncomplicated.  Follow up calcium level stable.  Tolerating diet.  Prepared for discharge POD#1.  Consults: None  Significant Diagnostic Studies: labs: calcium  Treatments: surgery: total thyroidectomy  Discharge Exam: Blood pressure 133/66, pulse 91, temperature 97.4 F (36.3 C), temperature source Oral, resp. rate 20, SpO2 96.00%. HEENT - clear Neck - incision clear and dry; min STS; voice slight hoarse Chest - clear Cor - RRR  Disposition: Home with family  Discharge Orders   Future Orders Complete By Expires     Apply dressing  As directed     Scheduling Instructions:      Apply light gauze dressing to neck before discharge today.    Diet - low sodium heart healthy  As directed     Discharge instructions  As directed     Comments:      THYROID & PARATHYROID SURGERY - POST OP INSTRUCTIONS  Always review your discharge instruction sheet from the facility where your surgery was performed.  A prescription for pain medication may be given to you upon discharge.  Take your pain medication as prescribed.  If narcotic pain medicine is not needed, then you may take acetaminophen (Tylenol) or ibuprofen (Advil) as needed.  Take your usually prescribed medications unless otherwise directed.  If you need a refill on your pain medication, please contact your pharmacy. They will contact our office to request authorization.  Prescriptions will not be processed after 5 pm or on weekends.  Start with a light diet upon arrival home, such as soup and crackers or  toast.  Be sure to drink plenty of fluids daily.  Resume your normal diet the day after surgery.  Most patients will experience some swelling and bruising on the chest and neck area.  Ice packs will help.  Swelling and bruising can take several days to resolve.   It is common to experience some constipation if taking pain medication after surgery.  Increasing fluid intake and taking a stool softener will usually help or prevent this problem.  A mild laxative (Milk of Magnesia or Miralax) should be taken according to package directions if there are no bowel movements after 48 hours.  You may remove your bandages 24-48 hours after surgery, and you may shower at that time.  You have steri-strips (small skin tapes) in place directly over the incision.  These strips should be left on the skin for 7-10 days and then removed.  You may resume regular (light) daily activities beginning the next day-such as daily self-care, walking, climbing stairs-gradually increasing activities as tolerated.  You may have sexual intercourse when it is comfortable.  Refrain from any heavy lifting or straining until approved by your doctor.  You may drive when you no longer are taking prescription pain medication, you can comfortably wear a seatbelt, and you can safely maneuver your car and apply brakes.  You should see your doctor in the office for a follow-up appointment approximately two to three weeks after your surgery.  Make sure that you call for this appointment within a day or two after you arrive home to insure a convenient appointment time.  WHEN  TO CALL YOUR DOCTOR: -- Fever greater than 101.5 -- Inability to urinate -- Nausea and/or vomiting - persistent -- Extreme swelling or bruising -- Continued bleeding from incision -- Increased pain, redness, or drainage from the incision -- Difficulty swallowing or breathing -- Muscle cramping or spasms -- Numbness or tingling in hands or around lips  The clinic  staff is available to answer your questions during regular business hours.  Please don't hesitate to call and ask to speak to one of the nurses if you have concerns.  Velora Heckler, MD, FACS General & Endocrine Surgery [facility] Office: [phone]    Increase activity slowly  As directed     Remove dressing in 24 hours  As directed         Medication List    TAKE these medications       acetaminophen 500 MG tablet  Commonly known as:  TYLENOL  Take 500 mg by mouth every 6 (six) hours as needed for pain.     ALPRAZolam 0.5 MG tablet  Commonly known as:  XANAX  Take 1 tablet (0.5 mg total) by mouth 2 (two) times daily as needed for anxiety.     aspirin EC 81 MG tablet  Take 81 mg by mouth daily.     calcium carbonate 1250 MG capsule  Take 1 capsule (1,250 mg total) by mouth 2 (two) times daily with a meal.     glimepiride 4 MG tablet  Commonly known as:  AMARYL  Take 4 mg by mouth daily before breakfast.     HYDROcodone-acetaminophen 5-325 MG per tablet  Commonly known as:  NORCO/VICODIN  Take 1-2 tablets by mouth every 4 (four) hours as needed for pain.     HYDROcodone-acetaminophen 10-325 MG per tablet  Commonly known as:  NORCO  Take 1 tablet by mouth every 6 (six) hours as needed for pain.     lisinopril 20 MG tablet  Commonly known as:  PRINIVIL,ZESTRIL  Take 20 mg by mouth daily before breakfast.     metFORMIN 500 MG tablet  Commonly known as:  GLUCOPHAGE  Take 500 mg by mouth 2 (two) times daily with a meal.     multivitamin with minerals Tabs  Take 1 tablet by mouth daily.     omeprazole 20 MG capsule  Commonly known as:  PRILOSEC  Take 20 mg by mouth 2 (two) times daily.     simvastatin 20 MG tablet  Commonly known as:  ZOCOR  Take 20 mg by mouth every evening.     SYNTHROID 100 MCG tablet  Generic drug:  levothyroxine  Take 1 tablet (100 mcg total) by mouth daily.     tamsulosin 0.4 MG Caps  Commonly known as:  FLOMAX   Take 0.4 mg by mouth daily.         Velora Heckler, MD, [facility] Office: [phone]   Signed: Velora Heckler 07/25/2012, 12:18 PM

## 2012-07-25 NOTE — Op Note (Signed)
NAME:  Edgar Chang, Edgar Chang NO.:  [id number]  MEDICAL RECORD NO.:  [id number]  LOCATION:  1502                         FACILITY:  [facility]  PHYSICIAN:  Velora Heckler, MD      DATE OF BIRTH:  Apr 15, 1941  DATE OF PROCEDURE:  07/24/2012                               OPERATIVE REPORT   PREOPERATIVE DIAGNOSIS:  Substernal thyroid goiter with tracheal deviation.  POSTOPERATIVE DIAGNOSIS:  Substernal thyroid goiter with tracheal deviation.  PROCEDURE:  Total thyroidectomy.  SURGEON:  Velora Heckler, MD, FACS  ANESTHESIA:  General per Dr. Eilene Ghazi.  ESTIMATED BLOOD LOSS:  Minimal.  PREPARATION:  ChloraPrep.  COMPLICATIONS:  None.  INDICATIONS:  The patient is a 72 year old white male referred by his neurosurgeon for newly-diagnosed substernal thyroid goiter.  The patient had been diagnosed with goiter approximately 30 years ago.  This had been followed clinically.  On preoperative assessment for lumbar disk surgery, the chest x-ray showed significant tracheal deviation.  A CT scan of the chest showed a bilobed enlarged mass in the right anterior mediastinum consistent with substernal goiter.  There was calcification in both the left and right lobes of the thyroid.  The patient was referred to General Surgery for thyroidectomy.  BODY OF REPORT:  Procedure was done in OR #11 at the [facility].  The patient was brought to the operating room, placed in a supine position on the operating room table.  Following administration of general anesthesia by Dr. Eilene Ghazi and his team, the patient was positioned and then prepped and draped in the usual strict aseptic fashion.  After ascertaining that an adequate level of anesthesia had been achieved, a Kocher incision was made with a #15 blade.  Dissection was carried through subcutaneous tissues and platysma.  Hemostasis was obtained with electrocautery.  Skin flaps were elevated cephalad and caudad from  the thyroid notch to the sternal notch.  A Mahorner self-retaining retractor was placed for exposure. Strap muscles were incised in the midline and dissection was begun on the left side.  Left thyroid lobe was multinodular.  It extends slightly beneath the left clavicle.  It was gently mobilized with blunt dissection.  Venous tributaries were divided between medium Ligaclips with the Harmonic scalpel.  Superior pole was dissected out and superior pole vessels divided individually between medium Ligaclips with the Harmonic scalpel.  Gland was rolled anteriorly.  Inferior venous tributaries were divided between Ligaclips.  Branches of the inferior thyroid artery were divided between small and medium Ligaclips. Recurrent laryngeal nerve was identified and preserved.  The inferior parathyroid gland was identified and preserved.  Ligament of Allyson Sabal was released with electrocautery and the gland was mobilized up and onto the anterior trachea.  Isthmus was mobilized across the midline.  There was no significant pyramidal lobe present.  Dry pack was placed in the left neck.  Next, we turned our attention to the right side.  Strap muscles were again reflected laterally.  Right superior pole was dissected out. Vessels were divided between Ligaclips with the Harmonic scalpel.  The mid and inferior portion of the gland extends beneath the clavicle into the mediastinum.  With gentle blunt dissection,  the scope of this mass was delineated.  The mass was quite large.  It was very hard.  It appeared to have a significant amount of central calcification.  It was gently mobilized with blunt dissection.  After completely mobilizing the superior pole, the remainder of the gland was delivered out of the anterior mediastinum with gentle blunt maneuvers in order to mobilize the lobe away from the surrounding structures.  Venous tributaries were divided between Ligaclips with the Harmonic scalpel.  Branches of  the inferior thyroid artery were divided between small and medium Ligaclips with the Harmonic scalpel.  Recurrent laryngeal nerve was identified and preserved.  Gland was rolled anteriorly and the ligament of Allyson Sabal was released with the electrocautery and the gland was mobilized onto the anterior trachea.  Remaining inferior venous tributaries were divided between Ligaclips and the gland was completely excised.  Suture was used to mark the right superior pole.  The entire thyroid gland was submitted to Pathology for review.  The neck was irrigated with warm saline.  Hemostasis was achieved with application of medium Ligaclips.  Fibrillar was placed throughout the operative field.  Strap muscles were reapproximated in the midline with interrupted 3-0 Vicryl sutures.  Platysma was closed with interrupted 3- 0 Vicryl sutures.  The skin was closed with a running 4-0 Monocryl subcuticular suture.  Wound was washed and dried and benzoin and Steri- Strips were applied.  Sterile dressings were applied.  The patient was awakened from anesthesia and brought to the recovery room.  The patient tolerated the procedure well.   Velora Heckler, MD, FACS General & Endocrine Surgery [facility] Office: [phone]   TMG/MEDQ  D:  07/24/2012  T:  07/25/2012  Job:  [id number]  cc:   Cristi Loron, M.D. Fax: [phone]  Arma Heading, MD Fax: [phone]

## 2012-07-29 NOTE — Progress Notes (Signed)
Quick Note:  Please contact patient and notify of benign pathology results.  Todd M. Gerkin, MD, FACS [facility] Office: [phone]   ______ 

## 2012-07-31 ENCOUNTER — Telehealth (INDEPENDENT_AMBULATORY_CARE_PROVIDER_SITE_OTHER): Payer: Self-pay

## 2012-07-31 NOTE — Telephone Encounter (Signed)
LMOM for pt to call to verify appt date and lab slip will be mailed to his home address.

## 2012-08-01 ENCOUNTER — Telehealth (INDEPENDENT_AMBULATORY_CARE_PROVIDER_SITE_OTHER): Payer: Self-pay

## 2012-08-01 NOTE — Telephone Encounter (Signed)
error 

## 2012-08-01 NOTE — Telephone Encounter (Signed)
Pt called and requested sooner appt due to wanting to move forward with his back surgery. Per Dr Gerrit Friends pt needs to come in 08-03-12 arrive at 3:15. Pt can get labs before or after office visit since pt lives [place]. The labs take 24hr and will not be avail for appt sameday. LMOM for pt to call for this update

## 2012-08-03 ENCOUNTER — Ambulatory Visit (INDEPENDENT_AMBULATORY_CARE_PROVIDER_SITE_OTHER): Payer: Medicare Other | Admitting: Surgery

## 2012-08-03 ENCOUNTER — Encounter (INDEPENDENT_AMBULATORY_CARE_PROVIDER_SITE_OTHER): Payer: Self-pay | Admitting: Surgery

## 2012-08-03 VITALS — BP 126/60 | HR 88 | Resp 18 | Ht 71.0 in | Wt 200.0 lb

## 2012-08-03 DIAGNOSIS — E049 Nontoxic goiter, unspecified: Secondary | ICD-10-CM

## 2012-08-03 NOTE — Progress Notes (Signed)
General Surgery [facility]  Visit Diagnoses: 1. Substernal thyroid goiter     HISTORY: Patient is a 72 year old white male who underwent total thyroidectomy for substernal thyroid goiter with tracheal deviation. 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 The large calcified mass was benign.  EXAM: Surgical wound is healing nicely. Mild soft tissue swelling. No sign of seroma. No sign of infection. Was quality is normal.  IMPRESSION: Status post total thyroidectomy for substernal thyroid goiter with calcification and tracheal deviation  PLAN: Patient will begin applying topical creams to his incisions. We will check a TSH level and serum calcium level in 3 weeks. He will return for follow-up in 6 weeks.  Patient is anxious to proceed with surgery for his lumbar spine due to chronic pain in the left lower extremity. From a surgical standpoint, he is cleared to proceed.  Velora Heckler, MD, FACS General & Endocrine Surgery [facility]

## 2012-08-03 NOTE — Patient Instructions (Signed)
  COCOA BUTTER & VITAMIN E CREAM  ([employer] or other brand)  Apply cocoa butter/vitamin E cream to your incision 2 - 3 times daily.  Massage cream into incision for one minute with each application.  Use sunscreen (50 SPF or higher) for first 6 months after surgery if area is exposed to sun.  You may substitute Mederma or other scar reducing creams as desired.   

## 2012-08-07 ENCOUNTER — Other Ambulatory Visit: Payer: Self-pay | Admitting: Neurosurgery

## 2012-08-07 ENCOUNTER — Encounter (HOSPITAL_COMMUNITY): Payer: Self-pay | Admitting: Pharmacy Technician

## 2012-08-10 ENCOUNTER — Encounter (HOSPITAL_COMMUNITY): Payer: Self-pay

## 2012-08-10 ENCOUNTER — Encounter (HOSPITAL_COMMUNITY)
Admission: RE | Admit: 2012-08-10 | Discharge: 2012-08-10 | Disposition: A | Discharge status: Home / Self Care | Payer: Medicare Other | Source: Ambulatory Visit | Attending: Neurosurgery | Admitting: Neurosurgery

## 2012-08-10 HISTORY — DX: Hypothyroidism, unspecified: E03.9

## 2012-08-10 HISTORY — DX: Anxiety disorder, unspecified: F41.9

## 2012-08-10 LAB — CBC
HCT: 37 % — ABNORMAL LOW (ref 39.0–52.0)
Hemoglobin: 13.1 g/dL (ref 13.0–17.0)
MCV: 82.8 fL (ref 78.0–100.0)
WBC: 6 10*3/uL (ref 4.0–10.5)

## 2012-08-10 LAB — SURGICAL PCR SCREEN
MRSA, PCR: NEGATIVE
Staphylococcus aureus: NEGATIVE

## 2012-08-10 LAB — BASIC METABOLIC PANEL
CO2: 28 mEq/L (ref 19–32)
Chloride: 104 mEq/L (ref 96–112)
GFR calc Af Amer: 76 mL/min — ABNORMAL LOW (ref >90)
Potassium: 4.5 mEq/L (ref 3.5–5.1)

## 2012-08-10 NOTE — Pre-Procedure Instructions (Addendum)
Edgar Chang  08/10/2012   Your procedure is scheduled on: 08/16/12  Report to [facility] at700 AM.  Call this number if you have problems the morning of surgery: [phone]   Remember:   Do not eat food or drink liquids after midnight.   Take these medicines the morning of surgery with A SIP OF WATER: xanax, pain med, synthroid, flomax, omeprazole, STOP multi vit, aspirin now   Do not wear jewelry, make-up or nail polish.  Do not wear lotions, powders, or perfumes. You may wear deodorant.  Do not shave 48 hours prior to surgery. Men may shave face and neck.  Do not bring valuables to the hospital.  Contacts, dentures or bridgework may not be worn into surgery.  Leave suitcase in the car. After surgery it may be brought to your room.  For patients admitted to the hospital, checkout time is 11:00 AM the day of  discharge.   Patients discharged the day of surgery will not be allowed to drive  home.  Name and phone number of your driver:   Special Instructions: Shower using CHG 2 nights before surgery and the night before surgery.  If you shower the day of surgery use CHG.  Use special wash - you have one bottle of CHG for all showers.  You should use approximately 1/3 of the bottle for each shower.   Please read over the following fact sheets that you were given: Pain Booklet, Coughing and Deep Breathing, MRSA Information and Surgical Site Infection Prevention

## 2012-08-15 MED ORDER — CEFAZOLIN SODIUM-DEXTROSE 2-3 GM-% IV SOLR
2.0000 g | INTRAVENOUS | Status: AC
  Administered 2012-08-16: 2 g via INTRAVENOUS
  Filled 2012-08-15: qty 50

## 2012-08-16 ENCOUNTER — Encounter (HOSPITAL_COMMUNITY): Payer: Self-pay | Admitting: Certified Registered"

## 2012-08-16 ENCOUNTER — Ambulatory Visit (HOSPITAL_COMMUNITY): Payer: Medicare Other | Admitting: Certified Registered"

## 2012-08-16 ENCOUNTER — Encounter (HOSPITAL_COMMUNITY)
Admission: RE | Disposition: A | Discharge status: Home Health | Payer: Self-pay | Source: Ambulatory Visit | Attending: Neurosurgery

## 2012-08-16 ENCOUNTER — Ambulatory Visit (HOSPITAL_COMMUNITY)
Admission: RE | Admit: 2012-08-16 | Discharge: 2012-08-17 | Disposition: A | Discharge status: Home Health | Payer: Medicare Other | Source: Ambulatory Visit | Attending: Neurosurgery | Admitting: Neurosurgery

## 2012-08-16 ENCOUNTER — Ambulatory Visit (HOSPITAL_COMMUNITY): Payer: Medicare Other

## 2012-08-16 ENCOUNTER — Encounter (HOSPITAL_COMMUNITY): Payer: Self-pay | Admitting: *Deleted

## 2012-08-16 DIAGNOSIS — I10 Essential (primary) hypertension: Secondary | ICD-10-CM | POA: Insufficient documentation

## 2012-08-16 DIAGNOSIS — Z01812 Encounter for preprocedural laboratory examination: Secondary | ICD-10-CM | POA: Insufficient documentation

## 2012-08-16 DIAGNOSIS — E119 Type 2 diabetes mellitus without complications: Secondary | ICD-10-CM | POA: Insufficient documentation

## 2012-08-16 DIAGNOSIS — Z79899 Other long term (current) drug therapy: Secondary | ICD-10-CM | POA: Insufficient documentation

## 2012-08-16 DIAGNOSIS — M5126 Other intervertebral disc displacement, lumbar region: Secondary | ICD-10-CM | POA: Insufficient documentation

## 2012-08-16 DIAGNOSIS — E785 Hyperlipidemia, unspecified: Secondary | ICD-10-CM | POA: Insufficient documentation

## 2012-08-16 DIAGNOSIS — N400 Enlarged prostate without lower urinary tract symptoms: Secondary | ICD-10-CM | POA: Insufficient documentation

## 2012-08-16 HISTORY — PX: LUMBAR LAMINECTOMY/DECOMPRESSION MICRODISCECTOMY: SHX5026

## 2012-08-16 LAB — GLUCOSE, CAPILLARY
Glucose-Capillary: 127 mg/dL — ABNORMAL HIGH (ref 70–99)
Glucose-Capillary: 126 mg/dL — ABNORMAL HIGH (ref 70–99)
Glucose-Capillary: 146 mg/dL — ABNORMAL HIGH (ref 70–99)

## 2012-08-16 SURGERY — LUMBAR LAMINECTOMY/DECOMPRESSION MICRODISCECTOMY 1 LEVEL
Anesthesia: General | Site: Spine Lumbar | Laterality: Left | Wound class: Clean

## 2012-08-16 MED ORDER — PANTOPRAZOLE SODIUM 40 MG PO TBEC: 40.0000 mg | DELAYED_RELEASE_TABLET | Freq: Every day | ORAL | Status: DC

## 2012-08-16 MED ORDER — SODIUM CHLORIDE 0.9 % IV SOLN
INTRAVENOUS | Status: AC
  Filled 2012-08-16: qty 500

## 2012-08-16 MED ORDER — MIDAZOLAM HCL 5 MG/5ML IJ SOLN
INTRAMUSCULAR | Status: DC | PRN
  Administered 2012-08-16 (×3): 1 mg via INTRAVENOUS

## 2012-08-16 MED ORDER — DIAZEPAM 5 MG PO TABS: 5.0000 mg | ORAL_TABLET | Freq: Four times a day (QID) | ORAL | Status: DC | PRN

## 2012-08-16 MED ORDER — ALUM & MAG HYDROXIDE-SIMETH 200-200-20 MG/5ML PO SUSP: 30.0000 mL | Freq: Four times a day (QID) | ORAL | Status: DC | PRN

## 2012-08-16 MED ORDER — EPHEDRINE SULFATE 50 MG/ML IJ SOLN
INTRAMUSCULAR | Status: DC | PRN
  Administered 2012-08-16 (×3): 5 mg via INTRAVENOUS

## 2012-08-16 MED ORDER — BACITRACIN 50000 UNITS IM SOLR
INTRAMUSCULAR | Status: AC
  Filled 2012-08-16: qty 1

## 2012-08-16 MED ORDER — ACETAMINOPHEN 325 MG PO TABS: 650.0000 mg | ORAL_TABLET | ORAL | Status: DC | PRN

## 2012-08-16 MED ORDER — ONDANSETRON HCL 4 MG/2ML IJ SOLN
INTRAMUSCULAR | Status: DC | PRN
  Administered 2012-08-16: 4 mg via INTRAVENOUS

## 2012-08-16 MED ORDER — ACETAMINOPHEN 650 MG RE SUPP: 650.0000 mg | RECTAL | Status: DC | PRN

## 2012-08-16 MED ORDER — BUPIVACAINE-EPINEPHRINE PF 0.5-1:200000 % IJ SOLN
INTRAMUSCULAR | Status: DC | PRN
  Administered 2012-08-16: 10 mL

## 2012-08-16 MED ORDER — BACITRACIN ZINC 500 UNIT/GM EX OINT
TOPICAL_OINTMENT | CUTANEOUS | Status: DC | PRN
  Administered 2012-08-16: 1 via TOPICAL

## 2012-08-16 MED ORDER — ONDANSETRON HCL 4 MG/2ML IJ SOLN: 4.0000 mg | INTRAMUSCULAR | Status: DC | PRN

## 2012-08-16 MED ORDER — SUCCINYLCHOLINE CHLORIDE 20 MG/ML IJ SOLN
INTRAMUSCULAR | Status: DC | PRN
  Administered 2012-08-16: 100 mg via INTRAVENOUS

## 2012-08-16 MED ORDER — STERILE WATER FOR IRRIGATION IR SOLN
Status: DC | PRN
  Administered 2012-08-16: 1000 mL

## 2012-08-16 MED ORDER — LISINOPRIL 20 MG PO TABS
20.0000 mg | ORAL_TABLET | Freq: Every day | ORAL | Status: DC
  Filled 2012-08-16 (×2): qty 1

## 2012-08-16 MED ORDER — LEVOTHYROXINE SODIUM 100 MCG PO TABS
100.0000 ug | ORAL_TABLET | Freq: Every day | ORAL | Status: DC
  Administered 2012-08-17: 100 ug via ORAL
  Filled 2012-08-16 (×2): qty 1

## 2012-08-16 MED ORDER — HYDROMORPHONE HCL PF 1 MG/ML IJ SOLN
INTRAMUSCULAR | Status: AC
  Filled 2012-08-16: qty 1

## 2012-08-16 MED ORDER — PHENOL 1.4 % MT LIQD: 1.0000 | OROMUCOSAL | Status: DC | PRN

## 2012-08-16 MED ORDER — NEOSTIGMINE METHYLSULFATE 1 MG/ML IJ SOLN
INTRAMUSCULAR | Status: DC | PRN
  Administered 2012-08-16: 4 mg via INTRAVENOUS

## 2012-08-16 MED ORDER — CEFAZOLIN SODIUM-DEXTROSE 2-3 GM-% IV SOLR
2.0000 g | Freq: Three times a day (TID) | INTRAVENOUS | Status: AC
  Administered 2012-08-16 – 2012-08-17 (×2): 2 g via INTRAVENOUS
  Filled 2012-08-16 (×2): qty 50

## 2012-08-16 MED ORDER — MENTHOL 3 MG MT LOZG: 1.0000 | LOZENGE | OROMUCOSAL | Status: DC | PRN

## 2012-08-16 MED ORDER — PROPOFOL 10 MG/ML IV BOLUS
INTRAVENOUS | Status: DC | PRN
  Administered 2012-08-16: 180 mg via INTRAVENOUS
  Administered 2012-08-16: 20 mg via INTRAVENOUS

## 2012-08-16 MED ORDER — HEMOSTATIC AGENTS (NO CHARGE) OPTIME
TOPICAL | Status: DC | PRN
  Administered 2012-08-16: 1 via TOPICAL

## 2012-08-16 MED ORDER — 0.9 % SODIUM CHLORIDE (POUR BTL) OPTIME
TOPICAL | Status: DC | PRN
  Administered 2012-08-16: 1000 mL

## 2012-08-16 MED ORDER — MORPHINE SULFATE 2 MG/ML IJ SOLN: 1.0000 mg | INTRAMUSCULAR | Status: DC | PRN

## 2012-08-16 MED ORDER — FENTANYL CITRATE 0.05 MG/ML IJ SOLN
INTRAMUSCULAR | Status: DC | PRN
  Administered 2012-08-16 (×3): 50 ug via INTRAVENOUS
  Administered 2012-08-16 (×2): 100 ug via INTRAVENOUS

## 2012-08-16 MED ORDER — THROMBIN 5000 UNITS EX SOLR
CUTANEOUS | Status: DC | PRN
  Administered 2012-08-16 (×2): 5000 [IU] via TOPICAL

## 2012-08-16 MED ORDER — INSULIN ASPART 100 UNIT/ML ~~LOC~~ SOLN: 0.0000 [IU] | Freq: Every day | SUBCUTANEOUS | Status: DC

## 2012-08-16 MED ORDER — ROCURONIUM BROMIDE 100 MG/10ML IV SOLN
INTRAVENOUS | Status: DC | PRN
  Administered 2012-08-16: 20 mg via INTRAVENOUS

## 2012-08-16 MED ORDER — CALCIUM CARBONATE 1250 (500 CA) MG PO TABS
1.0000 | ORAL_TABLET | Freq: Two times a day (BID) | ORAL | Status: DC
  Administered 2012-08-16: 500 mg via ORAL
  Filled 2012-08-16 (×4): qty 1

## 2012-08-16 MED ORDER — INSULIN ASPART 100 UNIT/ML ~~LOC~~ SOLN
0.0000 [IU] | SUBCUTANEOUS | Status: DC
  Administered 2012-08-16: 2 [IU] via SUBCUTANEOUS

## 2012-08-16 MED ORDER — HYDROMORPHONE HCL PF 1 MG/ML IJ SOLN: 0.2500 mg | INTRAMUSCULAR | Status: DC | PRN

## 2012-08-16 MED ORDER — LACTATED RINGERS IV SOLN
INTRAVENOUS | Status: DC
  Administered 2012-08-16: 18:00:00 via INTRAVENOUS

## 2012-08-16 MED ORDER — CALCIUM CARBONATE 1250 (500 CA) MG PO CAPS: 1250.0000 mg | ORAL_CAPSULE | Freq: Two times a day (BID) | ORAL | Status: DC

## 2012-08-16 MED ORDER — GLIMEPIRIDE 4 MG PO TABS
4.0000 mg | ORAL_TABLET | Freq: Every day | ORAL | Status: DC
  Filled 2012-08-16 (×2): qty 1

## 2012-08-16 MED ORDER — METFORMIN HCL 500 MG PO TABS
500.0000 mg | ORAL_TABLET | Freq: Two times a day (BID) | ORAL | Status: DC
  Administered 2012-08-16: 500 mg via ORAL
  Filled 2012-08-16 (×4): qty 1

## 2012-08-16 MED ORDER — MORPHINE SULFATE (PF) 1 MG/ML IV SOLN
INTRAVENOUS | Status: AC
  Filled 2012-08-16: qty 25

## 2012-08-16 MED ORDER — ADULT MULTIVITAMIN W/MINERALS CH
1.0000 | ORAL_TABLET | Freq: Every day | ORAL | Status: DC
  Administered 2012-08-16: 1 via ORAL
  Filled 2012-08-16 (×2): qty 1

## 2012-08-16 MED ORDER — HYDROCODONE-ACETAMINOPHEN 5-325 MG PO TABS
1.0000 | ORAL_TABLET | ORAL | Status: DC | PRN
  Administered 2012-08-16 (×2): 2 via ORAL
  Filled 2012-08-16 (×2): qty 2

## 2012-08-16 MED ORDER — OXYCODONE-ACETAMINOPHEN 5-325 MG PO TABS: 1.0000 | ORAL_TABLET | ORAL | Status: DC | PRN

## 2012-08-16 MED ORDER — SIMVASTATIN 20 MG PO TABS
20.0000 mg | ORAL_TABLET | Freq: Every evening | ORAL | Status: DC
  Administered 2012-08-16: 20 mg via ORAL
  Filled 2012-08-16 (×2): qty 1

## 2012-08-16 MED ORDER — INSULIN ASPART 100 UNIT/ML ~~LOC~~ SOLN
0.0000 [IU] | Freq: Three times a day (TID) | SUBCUTANEOUS | Status: DC
  Administered 2012-08-17: 2 [IU] via SUBCUTANEOUS

## 2012-08-16 MED ORDER — SODIUM CHLORIDE 0.9 % IR SOLN
Status: DC | PRN
  Administered 2012-08-16: 11:00:00

## 2012-08-16 MED ORDER — DOCUSATE SODIUM 100 MG PO CAPS
100.0000 mg | ORAL_CAPSULE | Freq: Two times a day (BID) | ORAL | Status: DC
  Administered 2012-08-16: 100 mg via ORAL
  Filled 2012-08-16: qty 1

## 2012-08-16 MED ORDER — GLYCOPYRROLATE 0.2 MG/ML IJ SOLN
INTRAMUSCULAR | Status: DC | PRN
  Administered 2012-08-16: .6 mg via INTRAVENOUS

## 2012-08-16 MED ORDER — LACTATED RINGERS IV SOLN
INTRAVENOUS | Status: DC | PRN
  Administered 2012-08-16 (×2): via INTRAVENOUS

## 2012-08-16 MED ORDER — TAMSULOSIN HCL 0.4 MG PO CAPS
0.4000 mg | ORAL_CAPSULE | Freq: Every day | ORAL | Status: DC
  Filled 2012-08-16: qty 1

## 2012-08-16 SURGICAL SUPPLY — 56 items
BAG DECANTER FOR FLEXI CONT (MISCELLANEOUS) ×2 IMPLANT
BENZOIN TINCTURE PRP APPL 2/3 (GAUZE/BANDAGES/DRESSINGS) ×2 IMPLANT
BLADE SURG ROTATE 9660 (MISCELLANEOUS) ×2 IMPLANT
BRUSH SCRUB EZ PLAIN DRY (MISCELLANEOUS) ×2 IMPLANT
BUR ACORN 6.0 (BURR) ×2 IMPLANT
BUR MATCHSTICK NEURO 3.0 LAGG (BURR) ×2 IMPLANT
CANISTER SUCTION 2500CC (MISCELLANEOUS) ×2 IMPLANT
CLOTH BEACON ORANGE TIMEOUT ST (SAFETY) ×2 IMPLANT
CONT SPEC 4OZ CLIKSEAL STRL BL (MISCELLANEOUS) ×2 IMPLANT
DECANTER SPIKE VIAL GLASS SM (MISCELLANEOUS) IMPLANT
DRAPE LAPAROTOMY 100X72X124 (DRAPES) ×2 IMPLANT
DRAPE MICROSCOPE LEICA (MISCELLANEOUS) ×2 IMPLANT
DRAPE POUCH INSTRU U-SHP 10X18 (DRAPES) ×2 IMPLANT
DRAPE SURG 17X23 STRL (DRAPES) ×8 IMPLANT
ELECT BLADE 4.0 EZ CLEAN MEGAD (MISCELLANEOUS) ×2
ELECT REM PT RETURN 9FT ADLT (ELECTROSURGICAL) ×2
ELECTRODE BLDE 4.0 EZ CLN MEGD (MISCELLANEOUS) ×1 IMPLANT
ELECTRODE REM PT RTRN 9FT ADLT (ELECTROSURGICAL) ×1 IMPLANT
GAUZE SPONGE 4X4 16PLY XRAY LF (GAUZE/BANDAGES/DRESSINGS) IMPLANT
GLOVE BIO SURGEON STRL SZ8.5 (GLOVE) ×2 IMPLANT
GLOVE BIOGEL PI IND STRL 7.0 (GLOVE) ×2 IMPLANT
GLOVE BIOGEL PI IND STRL 7.5 (GLOVE) ×2 IMPLANT
GLOVE BIOGEL PI INDICATOR 7.0 (GLOVE) ×2
GLOVE BIOGEL PI INDICATOR 7.5 (GLOVE) ×2
GLOVE ECLIPSE 7.5 STRL STRAW (GLOVE) ×2 IMPLANT
GLOVE EXAM NITRILE LRG STRL (GLOVE) IMPLANT
GLOVE EXAM NITRILE MD LF STRL (GLOVE) IMPLANT
GLOVE EXAM NITRILE XL STR (GLOVE) IMPLANT
GLOVE EXAM NITRILE XS STR PU (GLOVE) IMPLANT
GLOVE SS BIOGEL STRL SZ 8 (GLOVE) ×1 IMPLANT
GLOVE SUPERSENSE BIOGEL SZ 8 (GLOVE) ×1
GLOVE SURG SS PI 7.0 STRL IVOR (GLOVE) ×8 IMPLANT
GOWN BRE IMP SLV AUR LG STRL (GOWN DISPOSABLE) IMPLANT
GOWN BRE IMP SLV AUR XL STRL (GOWN DISPOSABLE) ×12 IMPLANT
GOWN STRL REIN 2XL LVL4 (GOWN DISPOSABLE) IMPLANT
KIT BASIN OR (CUSTOM PROCEDURE TRAY) ×2 IMPLANT
KIT ROOM TURNOVER OR (KITS) ×2 IMPLANT
NEEDLE HYPO 21X1.5 SAFETY (NEEDLE) IMPLANT
NEEDLE HYPO 22GX1.5 SAFETY (NEEDLE) ×2 IMPLANT
NS IRRIG 1000ML POUR BTL (IV SOLUTION) ×2 IMPLANT
PACK LAMINECTOMY NEURO (CUSTOM PROCEDURE TRAY) ×2 IMPLANT
PAD ARMBOARD 7.5X6 YLW CONV (MISCELLANEOUS) ×10 IMPLANT
PATTIES SURGICAL .5 X1 (DISPOSABLE) IMPLANT
RUBBERBAND STERILE (MISCELLANEOUS) ×4 IMPLANT
SPONGE GAUZE 4X4 12PLY (GAUZE/BANDAGES/DRESSINGS) ×2 IMPLANT
SPONGE SURGIFOAM ABS GEL SZ50 (HEMOSTASIS) ×2 IMPLANT
STRIP CLOSURE SKIN 1/2X4 (GAUZE/BANDAGES/DRESSINGS) ×2 IMPLANT
SUT VIC AB 1 CT1 18XBRD ANBCTR (SUTURE) ×1 IMPLANT
SUT VIC AB 1 CT1 8-18 (SUTURE) ×1
SUT VIC AB 2-0 CP2 18 (SUTURE) ×2 IMPLANT
SYR 20CC LL (SYRINGE) IMPLANT
SYR 20ML ECCENTRIC (SYRINGE) ×2 IMPLANT
TAPE CLOTH SURG 4X10 WHT LF (GAUZE/BANDAGES/DRESSINGS) ×2 IMPLANT
TOWEL OR 17X24 6PK STRL BLUE (TOWEL DISPOSABLE) ×2 IMPLANT
TOWEL OR 17X26 10 PK STRL BLUE (TOWEL DISPOSABLE) ×2 IMPLANT
WATER STERILE IRR 1000ML POUR (IV SOLUTION) ×2 IMPLANT

## 2012-08-16 NOTE — Transfer of Care (Signed)
Immediate Anesthesia Transfer of Care Note  Patient: Edgar Chang  Procedure(s) Performed: Procedure(s) with comments: LUMBAR LAMINECTOMY/DECOMPRESSION MICRODISCECTOMY 1 LEVEL (Left) - LEFT Lumbar Five- Sacral One diskectomy  Patient Location: PACU  Anesthesia Type:General  Level of Consciousness: sedated  Airway & Oxygen Therapy: Patient Spontanous Breathing and Patient connected to nasal cannula oxygen  Post-op Assessment: Report given to PACU RN and Post -op Vital signs reviewed and stable  Post vital signs: Reviewed and stable  Complications: No apparent anesthesia complications

## 2012-08-16 NOTE — Op Note (Signed)
Brief history: The patient is a 72 year old white male who complains of back and left leg pain consistent with a left L5 radiculopathy. He has failed medical management and was worked up with a lumbar MRI. This demonstrated a foraminal herniated disc at L5-S1 on the left. I discussed the various treatment option with the patient including surgery. The patient has weighed the risks, benefits, and alternatives surgery and decided proceed with a left L5-S1 discectomy.  Preoperative diagnosis: Left L5-S1 foraminal herniated disc, lumbago, lumbar radiculopathy, lumbar spinal stenosis  Postoperative diagnosis: The same  Procedure: L5-S1 foraminal Intervertebral discectomy using micro-dissection  Surgeon: Dr. Delma Officer  Asst.: Dr. Aliene Beams  Anesthesia: Gen. endotracheal  Estimated blood loss: Minimal  Drains: None  Complications: None  Description of procedure: The patient was brought to the operating room by the anesthesia team. General endotracheal anesthesia was induced. The patient was turned to the prone position on the Wilson frame. The patient's lumbosacral region was then prepared with Betadine scrub and Betadine solution. Sterile drapes were applied.  I then injected the area to be incised with Marcaine with epinephrine solution. I then used a scalpel to make a linear midline incision over the L5-S1 intervertebral disc space. I then used electrocautery to perform a left sided subperiosteal dissection exposing the spinous process and lamina of L5 and the upper sig from. We obtained intraoperative radiograph to confirm our location. I then inserted the [facility] retractor for exposure.  We then brought the operative microscope into the field. Under its magnification and illumination we completed the microdissection. I used a high-speed drill to perform a laminotomy at L5 on the left. I then used a Kerrison punches to widen the laminotomy and removed the ligamentum flavum at L5-S1 on  the left. I performed a foraminotomy about the left L5 nerve root. We then used microdissection to free up the thecal sac and the left L5 and S1 nerve root from the epidural tissue. I then used a Kerrison punch to perform a foraminotomy at about the left L5 and S1 nerve root. I carefully dissected around the exiting L5 nerve root. We encountered a foraminal herniated disc as expected. I removed in multiple fragments using the micropituitary forceps decompressing the L5 nerve root. We did not perform an intervertebral discectomy.  I then palpated along the ventral surface of the thecal sac and along exit route of the left L5 and S1 nerve root and noted that the neural structures were well decompressed. This completed the decompression.  We then obtained hemostasis using bipolar electrocautery. We irrigated the wound out with bacitracin solution. We then removed the retractor. We then reapproximated the patient's thoracolumbar fascia with interrupted #1 Vicryl suture. We then reapproximated the patient's subcutaneous tissue with interrupted 3-0 Vicryl suture. We then reapproximated patient's skin with Steri-Strips and benzoin. The was then coated with bacitracin ointment. The drapes were removed. The patient was subsequently returned to the supine position where they were extubated by the anesthesia team. The patient was then transported to the postanesthesia care unit in stable condition. All sponge instrument and needle counts were reportedly correct at the end of this case.

## 2012-08-16 NOTE — Anesthesia Preprocedure Evaluation (Signed)
Anesthesia Evaluation  Patient identified by MRN, date of birth, ID band Patient awake    Reviewed: Allergy & Precautions, H&P , NPO status , Patient's Chart, lab work & pertinent test results  Airway Mallampati: II      Dental   Pulmonary sleep apnea ,  breath sounds clear to auscultation        Cardiovascular hypertension, Rhythm:Regular Rate:Normal     Neuro/Psych Anxiety    GI/Hepatic GERD-  ,  Endo/Other  diabetesHypothyroidism   Renal/GU      Musculoskeletal   Abdominal   Peds  Hematology   Anesthesia Other Findings   Reproductive/Obstetrics                           Anesthesia Physical Anesthesia Plan  ASA: III  Anesthesia Plan: General   Post-op Pain Management:    Induction: Intravenous  Airway Management Planned: Oral ETT  Additional Equipment:   Intra-op Plan:   Post-operative Plan: Extubation in OR  Informed Consent: I have reviewed the patients History and Physical, chart, labs and discussed the procedure including the risks, benefits and alternatives for the proposed anesthesia with the patient or authorized representative who has indicated his/her understanding and acceptance.   Dental advisory given  Plan Discussed with: CRNA, Anesthesiologist and Surgeon  Anesthesia Plan Comments:         Anesthesia Quick Evaluation

## 2012-08-16 NOTE — Plan of Care (Signed)
Problem: Consults Goal: Diagnosis - Spinal Surgery Outcome: Completed/Met Date Met:  08/16/12 Microdiscectomy

## 2012-08-16 NOTE — H&P (Signed)
Subjective: The patient 72 year old white male who complains of back and left leg pain consistent with a lumbar radiculopathy. He has failed medical management and was worked up with a lumbar MRI. This demonstrated a L5-S1 herniated disc. I discussed situation with the patient. We discussed the various treatment options including surgery. The patient has weighed the risks, benefits, and alternatives surgery decided proceed with a left L5-S1 discectomy.   Past Medical History  Diagnosis Date  . Hypertension     sees Dr. Arma Heading  . Hyperlipidemia   . Diabetes mellitus without complication   . Benign prostate hyperplasia   . GERD (gastroesophageal reflux disease)   . Arthritis   . Cataract     left eye  . Chronic back pain   . Cough   . Goiter   . Hypothyroidism   . Anxiety   . Sleep apnea     uses cpap occass.not used for 2 yrs    Past Surgical History  Procedure Laterality Date  . Cardiovascular stress test      done approx. 3 years ago  . Eye surgery      right cataract removal  . Cholecystectomy  1985  . Prostate surgery  2003  . Rotator cuff repair Left 2006  . Thyroidectomy N/A 07/24/2012    Procedure:  Total THYROIDECTOMY;  Surgeon: Velora Heckler, MD;  Location: [facility];  Service: General;  Laterality: N/A;  Difficult Airway  . Hernia repair Left     No Known Allergies  History  Substance Use Topics  . Smoking status: Former Smoker -- 1.50 packs/day for 35 years    Types: Cigarettes    Quit date: 07/10/1984  . Smokeless tobacco: Never Used  . Alcohol Use: No    Family History  Problem Relation Age of Onset  . Heart disease Mother    Prior to Admission medications   Medication Sig Start Date End Date Taking? Authorizing Provider  acetaminophen (TYLENOL) 500 MG tablet Take 500 mg by mouth every 6 (six) hours as needed for pain.   Yes Historical Provider, MD  ALPRAZolam Prudy Feeler) 0.5 MG tablet Take 1 tablet (0.5 mg total) by mouth 2 (two) times daily as needed for  anxiety. 07/25/12  Yes Velora Heckler, MD  calcium carbonate 1250 MG capsule Take 1 capsule (1,250 mg total) by mouth 2 (two) times daily with a meal. 07/25/12  Yes Velora Heckler, MD  glimepiride (AMARYL) 4 MG tablet Take 4 mg by mouth daily before breakfast.   Yes Historical Provider, MD  HYDROcodone-acetaminophen (NORCO/VICODIN) 5-325 MG per tablet Take 1-2 tablets by mouth every 4 (four) hours as needed for pain. 07/25/12  Yes Velora Heckler, MD  lisinopril (PRINIVIL,ZESTRIL) 20 MG tablet Take 20 mg by mouth daily before breakfast.    Yes Historical Provider, MD  metFORMIN (GLUCOPHAGE) 500 MG tablet Take 500 mg by mouth 2 (two) times daily with a meal.   Yes Historical Provider, MD  omeprazole (PRILOSEC) 20 MG capsule Take 20 mg by mouth 2 (two) times daily.   Yes Historical Provider, MD  simvastatin (ZOCOR) 20 MG tablet Take 20 mg by mouth every evening.   Yes Historical Provider, MD  SYNTHROID 100 MCG tablet Take 1 tablet (100 mcg total) by mouth daily. 07/25/12  Yes Velora Heckler, MD  Tamsulosin HCl (FLOMAX) 0.4 MG CAPS Take 0.4 mg by mouth daily.   Yes Historical Provider, MD  aspirin EC 81 MG tablet Take 81 mg by mouth  daily.    Historical Provider, MD  Multiple Vitamin (MULTIVITAMIN WITH MINERALS) TABS Take 1 tablet by mouth daily.    Historical Provider, MD     Review of Systems  Positive ROS: As above  All other systems have been reviewed and were otherwise negative with the exception of those mentioned in the HPI and as above.  Objective: Vital signs in last 24 hours: Temp:  [97.7 F (36.5 C)] 97.7 F (36.5 C) (03/27 0657) Pulse Rate:  [88] 88 (03/27 0657) Resp:  [18] 18 (03/27 0657) BP: (145)/(76) 145/76 mmHg (03/27 0657) SpO2:  [95 %] 95 % (03/27 0657)  General Appearance: Alert, cooperative, no distress, appears stated age Head: Normocephalic, without obvious abnormality, atraumatic Eyes: PERRL, conjunctiva/corneas clear, EOM's intact, fundi benign, both eyes      Ears: Normal  TM's and external ear canals, both ears Throat: Lips, mucosa, and tongue normal; teeth and gums normal Neck: Supple, symmetrical, trachea midline, no adenopathy; thyroid: No enlargement/tenderness/nodules; no carotid bruit or JVD Back: Symmetric, no curvature, ROM normal, no CVA tenderness Lungs: Clear to auscultation bilaterally, respirations unlabored Heart: Regular rate and rhythm, S1 and S2 normal, no murmur, rub or gallop Abdomen: Soft, non-tender, bowel sounds active all four quadrants, no masses, no organomegaly Extremities: Extremities normal, atraumatic, no cyanosis or edema Pulses: 2+ and symmetric all extremities Skin: Skin color, texture, turgor normal, no rashes or lesions  NEUROLOGIC:   Mental status: alert and oriented, no aphasia, good attention span, Fund of knowledge/ memory ok Motor Exam - grossly normal Sensory Exam - grossly normal Reflexes:  Coordination - grossly normal Gait - grossly normal Balance - grossly normal Cranial Nerves: I: smell Not tested  II: visual acuity  OS: Normal    OD: Normal   II: visual fields Full to confrontation  II: pupils Equal, round, reactive to light  III,VII: ptosis None  III,IV,VI: extraocular muscles  Full ROM  V: mastication Normal  V: facial light touch sensation  Normal  V,VII: corneal reflex  Present  VII: facial muscle function - upper  Normal  VII: facial muscle function - lower Normal  VIII: hearing Not tested  IX: soft palate elevation  Normal  IX,X: gag reflex Present  XI: trapezius strength  5/5  XI: sternocleidomastoid strength 5/5  XI: neck flexion strength  5/5  XII: tongue strength  Normal    Data Review Lab Results  Component Value Date   WBC 6.0 08/10/2012   HGB 13.1 08/10/2012   HCT 37.0* 08/10/2012   MCV 82.8 08/10/2012   PLT 211 08/10/2012   Lab Results  Component Value Date   NA 141 08/10/2012   K 4.5 08/10/2012   CL 104 08/10/2012   CO2 28 08/10/2012   BUN 18 08/10/2012   CREATININE 1.08  08/10/2012   GLUCOSE 173* 08/10/2012   No results found for this basename: INR, PROTIME    Assessment/Plan: Left L5-S1 herniated disc, lumbago, lumbar radiculopathy: I discussed situation with the patient. I reviewed his MR scan with them and pointed out the abnormalities. We have discussed the various including surgery. I have described the surgical treatment option of a left L5-S1 discectomy. I have described the surgery to him. I've shown him surgical models. We have discussed the risks, benefits, alternatives, and likelihood of achieving our goals with surgery. I have answered all the patient's questions. The patient has decided to proceed with surgery.   Ariyanah Aguado D 08/16/2012 10:25 AM

## 2012-08-16 NOTE — Preoperative (Signed)
Beta Blockers   Reason not to administer Beta Blockers:Not Applicable 

## 2012-08-16 NOTE — Anesthesia Postprocedure Evaluation (Signed)
  Anesthesia Post-op Note  Patient: Edgar Chang  Procedure(s) Performed: Procedure(s) with comments: LUMBAR LAMINECTOMY/DECOMPRESSION MICRODISCECTOMY 1 LEVEL (Left) - LEFT Lumbar Five- Sacral One diskectomy  Patient Location: PACU  Anesthesia Type:General  Level of Consciousness: awake  Airway and Oxygen Therapy: Patient Spontanous Breathing  Post-op Pain: mild  Post-op Assessment: Post-op Vital signs reviewed  Post-op Vital Signs: Reviewed  Complications: No apparent anesthesia complications

## 2012-08-16 NOTE — Anesthesia Procedure Notes (Signed)
Procedure Name: Intubation Date/Time: 08/16/2012 10:42 AM Performed by: Armandina Gemma Pre-anesthesia Checklist: Patient identified, Timeout performed, Emergency Drugs available, Suction available and Patient being monitored Patient Re-evaluated:Patient Re-evaluated prior to inductionOxygen Delivery Method: Circle system utilized Preoxygenation: Pre-oxygenation with 100% oxygen Intubation Type: IV induction Ventilation: Mask ventilation without difficulty Grade View: Grade I Tube type: Oral Number of attempts: 1 Airway Equipment and Method: Stylet and Video-laryngoscopy Placement Confirmation: ETT inserted through vocal cords under direct vision,  breath sounds checked- equal and bilateral and positive ETCO2 Secured at: 22 cm Tube secured with: Tape Dental Injury: Teeth and Oropharynx as per pre-operative assessment  Comments: Atraumatic teeth as preop- bilat BS edwards md- IV induction Edwards- Intubation AM CRNA

## 2012-08-17 ENCOUNTER — Encounter (HOSPITAL_COMMUNITY): Payer: Self-pay | Admitting: Neurosurgery

## 2012-08-17 MED ORDER — OXYCODONE-ACETAMINOPHEN 5-325 MG PO TABS: 1.0000 | ORAL_TABLET | ORAL | Status: DC | PRN

## 2012-08-17 MED ORDER — DIAZEPAM 5 MG PO TABS: 5.0000 mg | ORAL_TABLET | Freq: Four times a day (QID) | ORAL | Status: DC | PRN

## 2012-08-17 MED ORDER — DSS 100 MG PO CAPS: 100.0000 mg | ORAL_CAPSULE | Freq: Two times a day (BID) | ORAL | Status: DC

## 2012-08-17 NOTE — Discharge Summary (Signed)
  Physician Discharge Summary  Patient ID: Edgar Chang MRN: [id number] DOB/AGE: 1940-07-18 71 y.o.  Admit date: 08/16/2012 Discharge date: 08/17/2012  Admission Diagnoses: Left L5-S1 herniated disc, lumbago, lumbar radiculopathy  Discharge Diagnoses: The same Active Problems:   * No active hospital problems. *   Discharged Condition: good  Hospital Course: I admitted the patient to [facility] on 08/16/2012. On that day I performed a left L5-S1 discectomy. The surgery were well.  The patient's postoperative course was unremarkable. The patient has no more leg pain. On postop day #1 the patient requested discharge to home. He was given oral and written discharge instructions. All his questions were answered.    Consults: None Significant Diagnostic Studies: None Treatments: Left L5-S1 discectomy using microdissection Discharge Exam: Blood pressure 123/73, pulse 101, temperature 97.9 F (36.6 C), temperature source Oral, resp. rate 18, SpO2 93.00%. Patient is alert and oriented. He looks well. His dressing is clean and dry. His lower extremity strength is normal.  Disposition: Home   Future Appointments Provider Department Dept Phone   09/12/2012 10:15 AM Velora Heckler, MD [facility], [place] [phone]       Medication List    STOP taking these medications       acetaminophen 500 MG tablet  Commonly known as:  TYLENOL     ALPRAZolam 0.5 MG tablet  Commonly known as:  XANAX     HYDROcodone-acetaminophen 5-325 MG per tablet  Commonly known as:  NORCO/VICODIN      TAKE these medications       aspirin EC 81 MG tablet  Take 81 mg by mouth daily.     calcium carbonate 1250 MG capsule  Take 1 capsule (1,250 mg total) by mouth 2 (two) times daily with a meal.     diazepam 5 MG tablet  Commonly known as:  VALIUM  Take 1 tablet (5 mg total) by mouth every 6 (six) hours as needed.     DSS 100 MG Caps  Take 100 mg by mouth 2 (two) times daily.      glimepiride 4 MG tablet  Commonly known as:  AMARYL  Take 4 mg by mouth daily before breakfast.     lisinopril 20 MG tablet  Commonly known as:  PRINIVIL,ZESTRIL  Take 20 mg by mouth daily before breakfast.     metFORMIN 500 MG tablet  Commonly known as:  GLUCOPHAGE  Take 500 mg by mouth 2 (two) times daily with a meal.     multivitamin with minerals Tabs  Take 1 tablet by mouth daily.     omeprazole 20 MG capsule  Commonly known as:  PRILOSEC  Take 20 mg by mouth 2 (two) times daily.     oxyCODONE-acetaminophen 5-325 MG per tablet  Commonly known as:  PERCOCET/ROXICET  Take 1-2 tablets by mouth every 4 (four) hours as needed.     simvastatin 20 MG tablet  Commonly known as:  ZOCOR  Take 20 mg by mouth every evening.     SYNTHROID 100 MCG tablet  Generic drug:  levothyroxine  Take 1 tablet (100 mcg total) by mouth daily.     tamsulosin 0.4 MG Caps  Commonly known as:  FLOMAX  Take 0.4 mg by mouth daily.         SignedCristi Loron 08/17/2012, 7:51 AM

## 2012-08-24 ENCOUNTER — Encounter (INDEPENDENT_AMBULATORY_CARE_PROVIDER_SITE_OTHER): Payer: Medicare Other | Admitting: Surgery

## 2012-08-28 MED ORDER — THROMBIN 20000 UNITS EX SOLR
CUTANEOUS | Status: AC
  Filled 2012-08-28: qty 20000

## 2012-09-12 ENCOUNTER — Ambulatory Visit (INDEPENDENT_AMBULATORY_CARE_PROVIDER_SITE_OTHER): Payer: Medicare Other | Admitting: Surgery

## 2012-09-12 ENCOUNTER — Encounter (INDEPENDENT_AMBULATORY_CARE_PROVIDER_SITE_OTHER): Payer: Self-pay | Admitting: Surgery

## 2012-09-12 VITALS — BP 122/70 | HR 88 | Temp 97.2°F | Resp 20 | Ht 71.0 in | Wt 195.0 lb

## 2012-09-12 DIAGNOSIS — E049 Nontoxic goiter, unspecified: Secondary | ICD-10-CM

## 2012-09-12 MED ORDER — SYNTHROID 88 MCG PO TABS: 88.0000 ug | ORAL_TABLET | Freq: Every day | ORAL | Status: AC

## 2012-09-12 NOTE — Progress Notes (Signed)
General Surgery [facility]  Visit Diagnoses: 1. Substernal thyroid goiter     HISTORY: Patient returns for final postoperative visit having undergone total thyroidectomy for substernal goiter. Patient is currently taking Synthroid 100 mcg daily. TSH level is slightly low at 0.320.  Calcium level is normal at 9.9.  Patient has undergone successful spine surgery with relief of his discomfort in the lower extremity. He is recovering well.  EXAM: Cervical incision is well-healed. Good cosmetic result. No significant soft tissue swelling. Voice quality is normal.  IMPRESSION: Status post total thyroidectomy for substernal goiter with tracheal deviation  PLAN: I am going to decrease his dose of Synthroid to 88 mcg daily. Patient will see his primary care physician for future TSH levels and dosage adjustment.  Patient will return for surgical care as needed.  Velora Heckler, MD, FACS General & Endocrine Surgery [facility]

## 2012-09-12 NOTE — Patient Instructions (Signed)

## 2012-09-20 ENCOUNTER — Encounter (INDEPENDENT_AMBULATORY_CARE_PROVIDER_SITE_OTHER): Payer: Self-pay

## 2013-05-23 IMAGING — CR DG CHEST 2V
2 series · 2 of 2 positions shown · non-contrast
Comparison: None.

CLINICAL DATA: Preop evaluation for lumbar spine surgery,
hypertension, diabetes, prior smoker

CHEST - 2 VIEW

[view not recorded (1 of 2)]
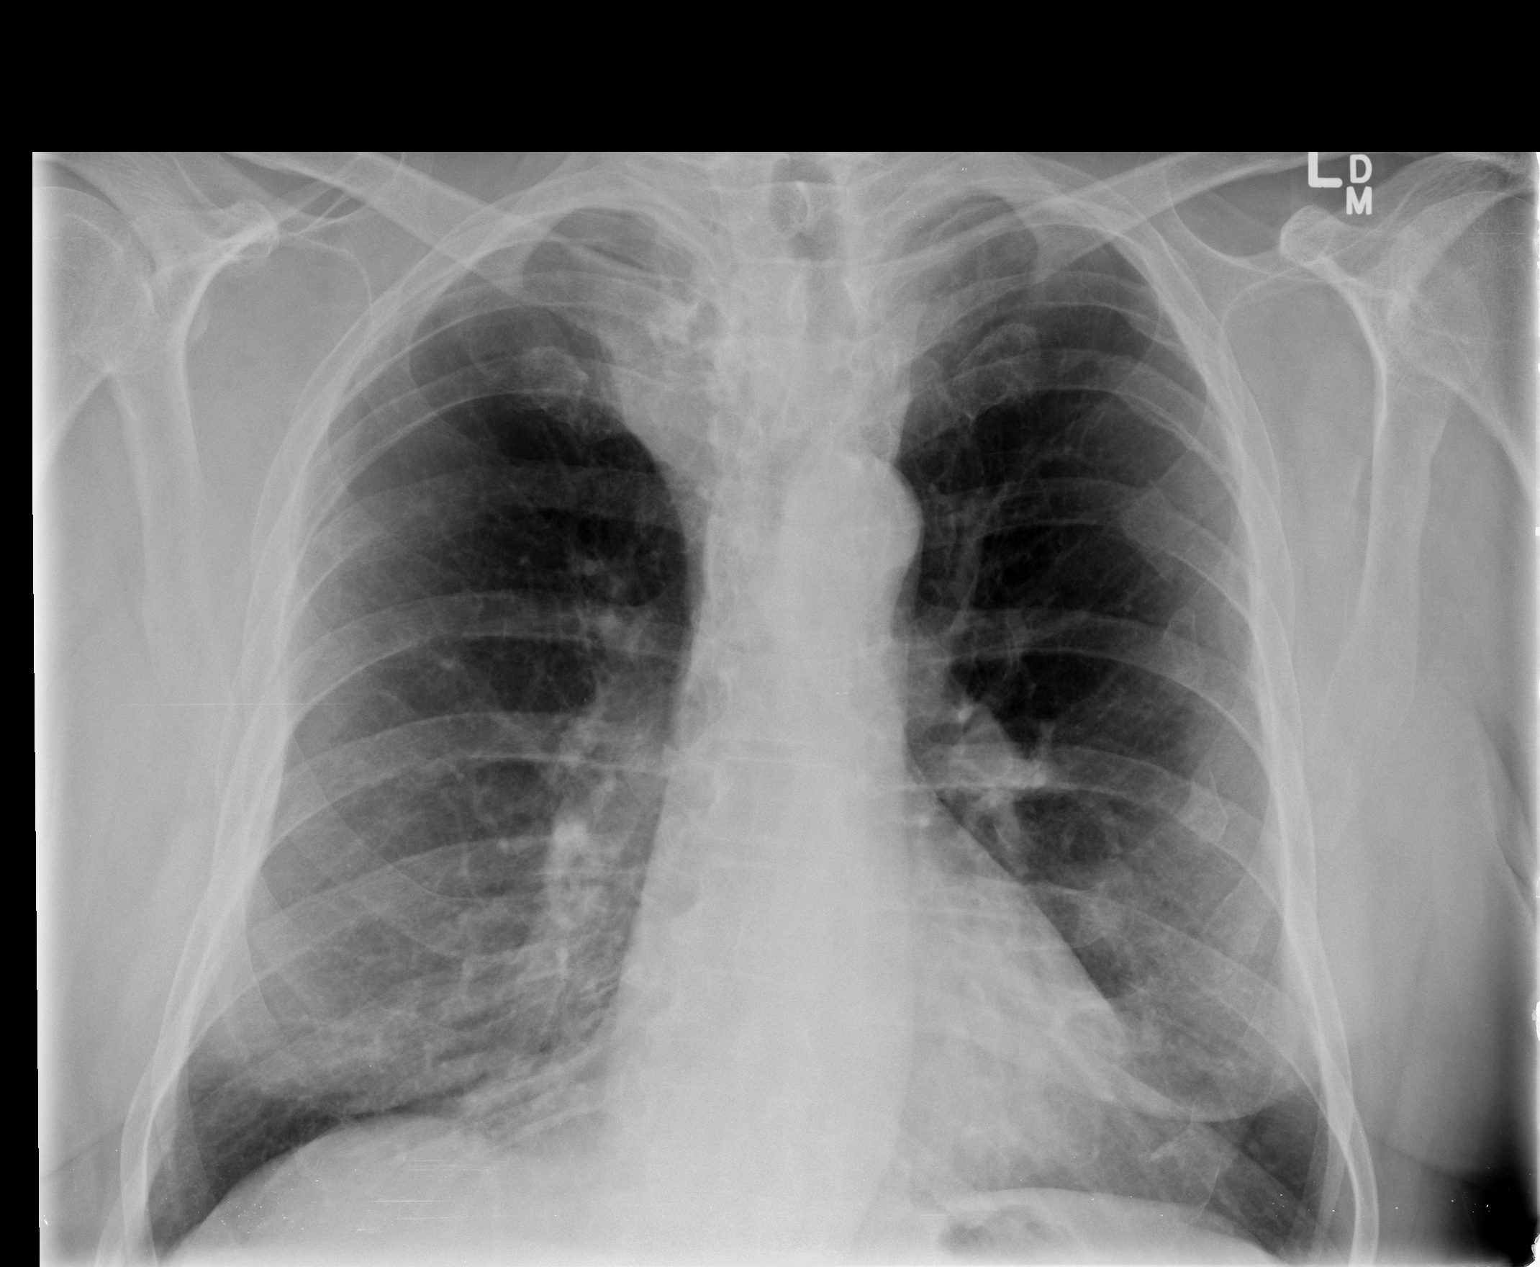

[view not recorded (2 of 2)]
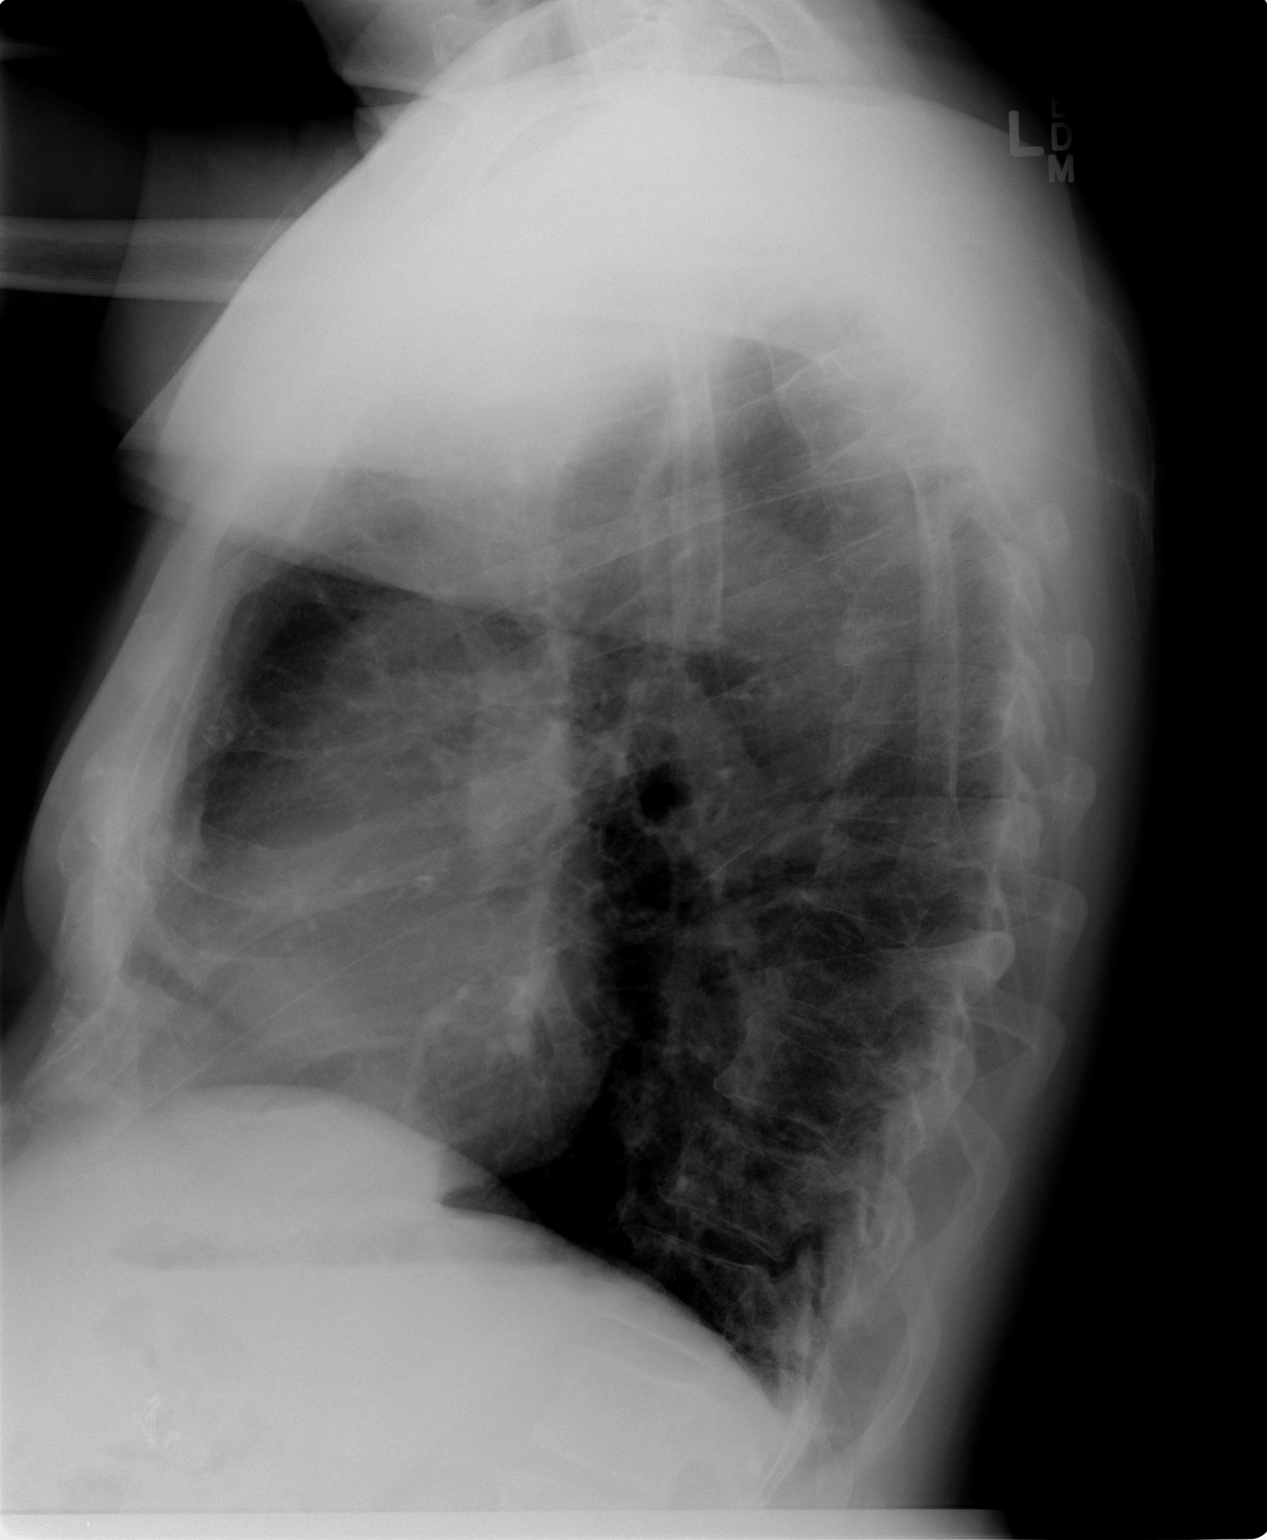

[2 of 2 positions shown; findings below may reference images not displayed]

FINDINGS: Hyperinflation noted with flat hemidiaphragms compatible
with COPD/emphysema.  Normal heart size and vascularity.  Healed
left rib fractures evident with deformity.  Negative for edema,
pneumonia, collapse, consolidation, significant effusion,
pneumothorax.

Right paratracheal soft tissue prominence noted with slight
leftward tracheal deviation.  Difficult to exclude a right
paratracheal mass or enlarged thyroid goiter extending into the
thoracic inlet.  No available comparisons.  Consider further
evaluation with chest CT.
IMPRESSION: Hyperinflation compatible with COPD/emphysema.

No superimposed acute process

Right paratracheal prominence with leftward tracheal deviation.
See above comment and recommendation.

This has been made a call report.

## 2013-07-17 IMAGING — CR DG LUMBAR SPINE 1V
1 series · 1 of 1 positions shown · non-contrast
Comparison: MRI lumbar spine dated 04/12/2012

CLINICAL DATA: Left L5-S1 laminectomy and discectomy

LUMBAR SPINE - 1 VIEW

[view not recorded]
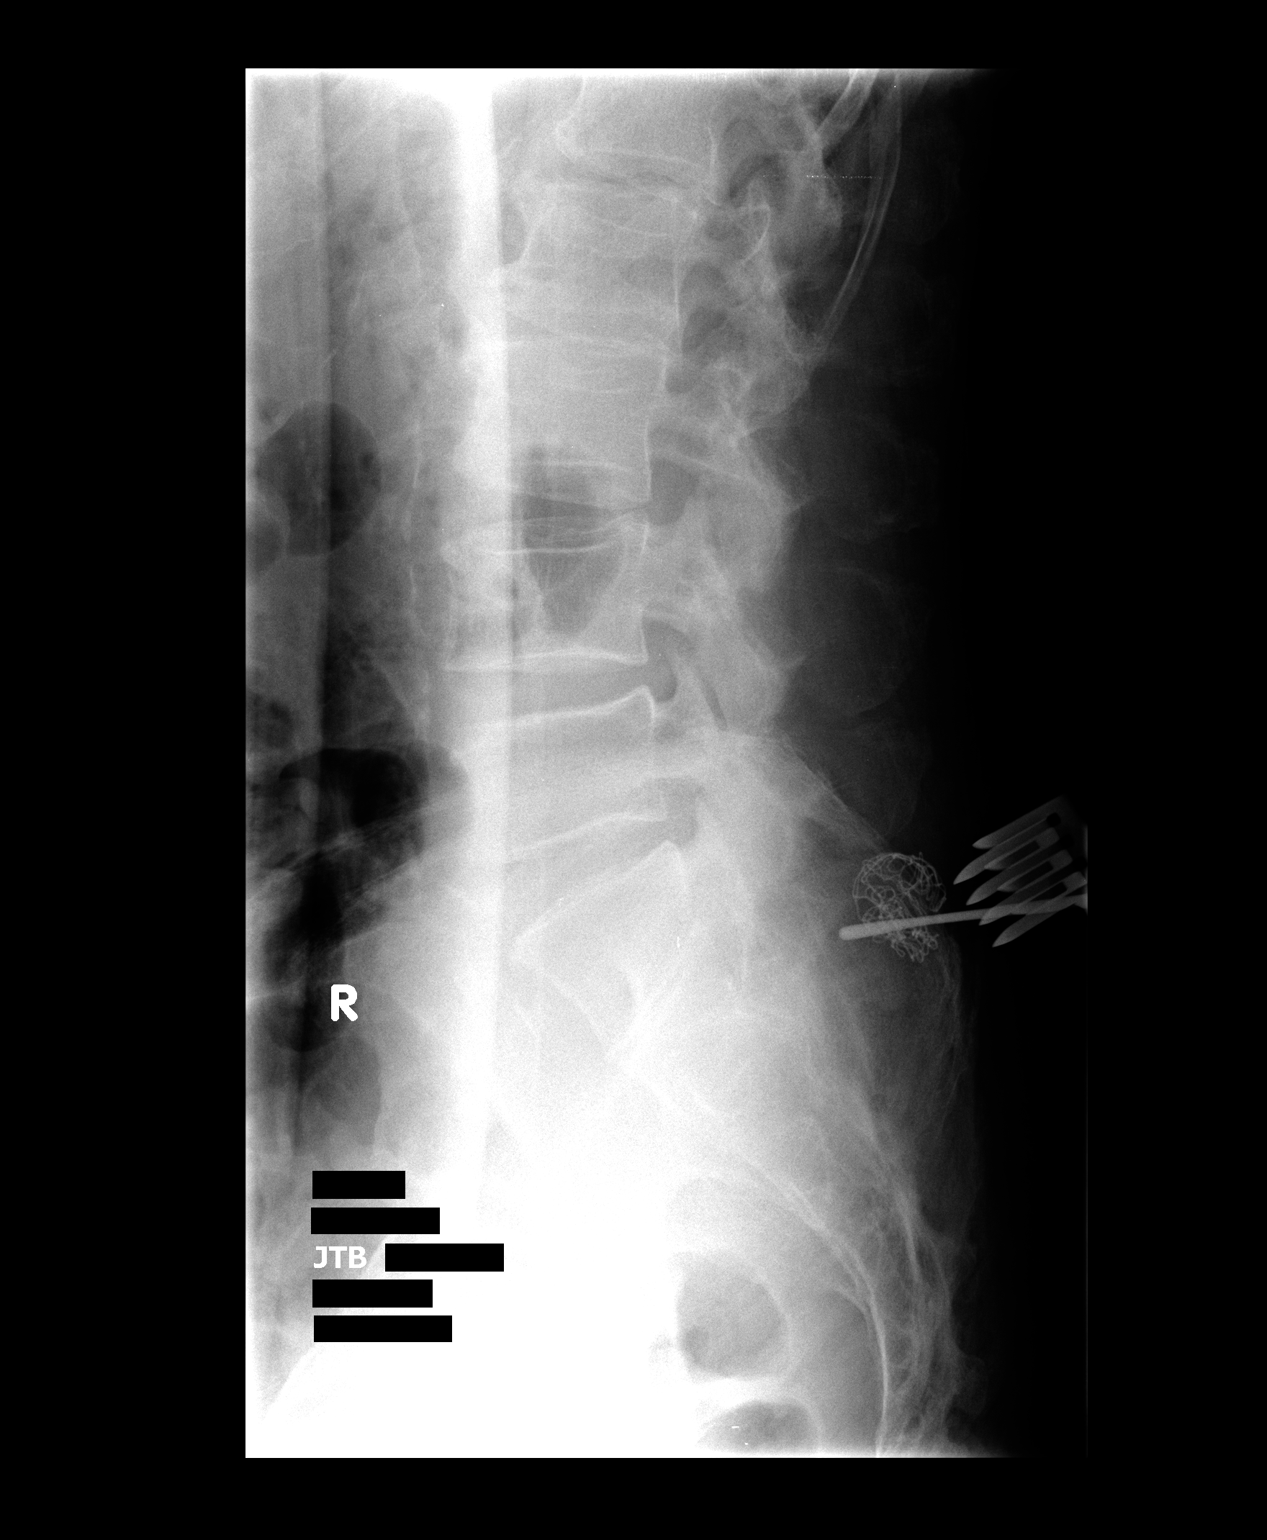

[1 of 1 positions shown; findings below may reference images not displayed]

FINDINGS: Intraoperative radiograph demonstrates a surgical probe
at S1-2.
IMPRESSION: Intraoperative localization as above.

## 2016-04-07 ENCOUNTER — Telehealth: Payer: Self-pay | Admitting: Neurology

## 2016-04-07 ENCOUNTER — Encounter: Payer: Self-pay | Admitting: Neurology

## 2016-04-07 ENCOUNTER — Ambulatory Visit (INDEPENDENT_AMBULATORY_CARE_PROVIDER_SITE_OTHER): Payer: Medicare Other | Admitting: Neurology

## 2016-04-07 VITALS — BP 120/78 | HR 83 | Ht 71.0 in | Wt 197.8 lb

## 2016-04-07 DIAGNOSIS — R569 Unspecified convulsions: Secondary | ICD-10-CM

## 2016-04-07 DIAGNOSIS — F05 Delirium due to known physiological condition: Secondary | ICD-10-CM | POA: Diagnosis not present

## 2016-04-07 DIAGNOSIS — M5416 Radiculopathy, lumbar region: Secondary | ICD-10-CM

## 2016-04-07 MED ORDER — LAMOTRIGINE 100 MG PO TABS: 100.0000 mg | ORAL_TABLET | Freq: Two times a day (BID) | ORAL | 11 refills | Status: AC

## 2016-04-07 MED ORDER — LAMOTRIGINE 25 MG PO TABS: ORAL_TABLET | ORAL | 0 refills | Status: DC

## 2016-04-07 NOTE — Telephone Encounter (Addendum)
Spoke to patient's wife, she is aware that [facility] and Dr. Terrace ArabiaYan will review them.  They also went by the hospital and picked up a disc with his MRI.  She wants to know if she should bring it by our office or just wait until his follow up appt.  She will wait for our call.

## 2016-04-07 NOTE — Progress Notes (Addendum)
PATIENT: Edgar Chang DOB: 05-Jan-1941  Chief Complaint  Patient presents with  . Acute episode of confusion    He is here with his wife, Inez Catalina.  He experienced a state of confusion on 03/03/16.  He was taken to [facility] for evaluation and was admitted for five days.  They are uncertain about his final diagnosis.    Marland Kitchen PCP    Lonia Mad, MD  . Neurosurgeon    Newman Pies, MD.  He is requesting clearance for his pending back surgery.     HISTORICAL  Edgar Chang is a 75 years old right-handed male, accompanied by his wife seen in refer by neurosurgeon Dr. Newman Pies for presurgical clarification, I also have referring note from his primary care physician Dr. Adline Potter from [place].  I reviewed and summarized medical record, he had a past medical history of obstructive sleep apnea using CPAP machine, but not using it now, he could not tolerate the machine, also had a history of hypertension, hyperlipidemia, left rotator cuff surgery in the past, left hip bursitis, hypothyroidism, history of thyroidectomy, on supplement, depression anxiety, he has been taking Lexapro 10 mg for 6 months, he denies significant improvement, also had a history of enlarged prostate, status post TURP in 2002  He had a history of chronic low back pain, had previous history of lumbar decompression surgery, with good result, he had recurrent left-sided low back pain, radiating pain to left lower extremity over the past 3 years, I have personally reviewed MRI lumbar in October 2017, there was evidence of 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 Arnoldo Morale wants to have clarification prior to the surgery.  He was admitted to the hospital in October 2017, he had recurrent severe low back pain then, presented to urgent care on March 01 2016, he  was given Toradol shots, and the prescription of baclofen 10 mg twice a day, 2 days later, on March 03 2016, he did take his morning dose of baclofen 10 mg, one time have breakfast with his brother and sisters around 8 AM, came back home around 10 AM, when he returned home, he was noted that his eyes were wide open, he complains of not feeling well, went to sleep, shortly afterwards, his wife saw him acting confused, taking off his clothes, came to his wife naked, the confusion remained for few hours, eventually leading to 911 call, hospital admission, he had no recollection of his hospital stay, and rehabilitation stay afterwards.  Per wife, he had MRI of the brain, EEG, was evaluated by neurologist Dr.Huratato Phone [phone], is now taking Keppra 500 mg once a day, there was no recurrent episode, patient stated that his confusion last about 4 days, now his back to his baseline, only has mild intermittent left-sided low back pain. No bowel and bladder incontinence no significant gait abnormality.  I reviewed the record from [facility] on March 04 2016, per record, he had history of non-insulin-dependent type 2 diabetes, hypertension, hyperlipidemia, hypothyroidism, and GERD, he was noted to be fidgety and restless, prompting the emergency room visit, MRI of the brain showed no acute intracranial abnormality but does has moderate severe periventricular white matter disease, atrophy, there was a concern of complex partial seizure, versus metabolic encephalopathy, versus side effect from baclofen, EEG showed possible seizure-like activity, he was started on  Depakote 500 mg twice a day,   I reviewed laboratory evaluation, normal CMP with creatinine of 0.89, CBC, with hemoglobin of 13,004, chest x-ray showed no acute abnormality, vitamin D was 67:67, folic acid was normal 26, A1c was 6.9, troponin was negative, LDL 63 cholesterol 139, vitamin D was 19, Depakote level March 07 2016 was 18, RPR was  negative for culture was negative,   chest x-ray showed no acute cardiopulmonary disease,   EEG showed disorganized background, and there was reported epileptiform activity of bilateral cerebral, there was also reported restless legs, 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  REVIEW OF SYSTEMS: Full 14 system review of systems performed and notable only for weakness.   ALLERGIES: No Known Allergies  HOME MEDICATIONS: Current Outpatient Prescriptions  Medication Sig Dispense Refill  . amLODipine (NORVASC) 5 MG tablet daily.    Marland Kitchen aspirin EC 81 MG tablet Take 81 mg by mouth daily.    Marland Kitchen escitalopram (LEXAPRO) 10 MG tablet daily.    . finasteride (PROSCAR) 5 MG tablet daily.    Marland Kitchen glimepiride (AMARYL) 4 MG tablet Take 4 mg by mouth daily before breakfast.    . levETIRAcetam (KEPPRA) 500 MG tablet daily.    Marland Kitchen lisinopril (PRINIVIL,ZESTRIL) 20 MG tablet Take 20 mg by mouth daily before breakfast.     . metFORMIN (GLUCOPHAGE) 500 MG tablet Take 500 mg by mouth 2 (two) times daily with a meal.    . omeprazole (PRILOSEC) 20 MG capsule Take 20 mg by mouth 2 (two) times daily.    . simvastatin (ZOCOR) 20 MG tablet Take 20 mg by mouth every evening.    Marland Kitchen SYNTHROID 88 MCG tablet Take 1 tablet (88 mcg total) by mouth daily. 30 tablet 3  . Tamsulosin HCl (FLOMAX) 0.4 MG CAPS Take 0.4 mg by mouth daily.    . Vitamin D, Ergocalciferol, (DRISDOL) 50000 units CAPS capsule once a week.     No current facility-administered medications for this visit.     PAST MEDICAL HISTORY: Past Medical History:  Diagnosis Date  . Anxiety   . Arthritis   . Benign prostate hyperplasia   . Cataract    left eye  . Chronic back pain   . Cough   . Diabetes mellitus without complication ([place])   . GERD (gastroesophageal reflux disease)   . Goiter   . Hyperlipidemia   . Hypertension    sees Dr. Lonia Mad  . Hypothyroidism   . Sleep apnea    uses cpap occass.not used for 2 yrs    PAST SURGICAL  HISTORY: Past Surgical History:  Procedure Laterality Date  . CARDIOVASCULAR STRESS TEST     done approx. 3 years ago  . CHOLECYSTECTOMY  1985  . EYE SURGERY     right cataract removal  . HERNIA REPAIR Left   . LUMBAR LAMINECTOMY/DECOMPRESSION MICRODISCECTOMY Left 08/16/2012   Procedure: LUMBAR LAMINECTOMY/DECOMPRESSION MICRODISCECTOMY 1 LEVEL;  Surgeon: Ophelia Charter, MD;  Location: [facility];  Service: Neurosurgery;  Laterality: Left;  LEFT Lumbar Five- Sacral One diskectomy  . PROSTATE SURGERY  2003  . ROTATOR CUFF REPAIR Left 2006  . THYROIDECTOMY N/A 07/24/2012   Procedure:  Total THYROIDECTOMY;  Surgeon: Earnstine Regal, MD;  Location: [facility];  Service: General;  Laterality: N/A;  Difficult Airway    FAMILY HISTORY: Family History  Problem Relation Age of Onset  . Heart disease Mother   . Multiple myeloma Father     SOCIAL HISTORY:  Social History  Social History  . Marital status: Married    Spouse name: N/A  . Number of children: 2  . Years of education: 12th   Occupational History  . Retired    Social History Main Topics  . Smoking status: Former Smoker    Packs/day: 1.50    Years: 35.00    Types: Cigarettes    Quit date: 07/10/1984  . Smokeless tobacco: Never Used  . Alcohol use No  . Drug use: No  . Sexual activity: Not on file   Other Topics Concern  . Not on file   Social History Narrative   Lives at home with his wife.   Right-handed.   2 cups caffeine per day.     PHYSICAL EXAM   Vitals:   04/07/16 1103  BP: 120/78  Pulse: 83  Weight: 197 lb 12 oz (89.7 kg)  Height: 5' 11"  (1.803 m)    Not recorded      Body mass index is 27.58 kg/m.  PHYSICAL EXAMNIATION:  Gen: NAD, conversant, well nourised, obese, well groomed                     Cardiovascular: Regular rate rhythm, no peripheral edema, warm, nontender. Eyes: Conjunctivae clear without exudates or hemorrhage Neck: Supple, no carotid bruits. Pulmonary: Clear to  auscultation bilaterally   NEUROLOGICAL EXAM:  MENTAL STATUS: Speech:    Speech is normal; fluent and spontaneous with normal comprehension.  Cognition:     Orientation to time, place and person     Normal recent and remote memory     Normal Attention span and concentration     Normal Language, naming, repeating,spontaneous speech     Fund of knowledge   CRANIAL NERVES: CN II: Visual fields are full to confrontation. Fundoscopic exam is normal with sharp discs and no vascular changes. Pupils are round equal and briskly reactive to light. CN III, IV, VI: extraocular movement are normal. No ptosis. CN V: Facial sensation is intact to pinprick in all 3 divisions bilaterally. Corneal responses are intact.  CN VII: Face is symmetric with normal eye closure and smile. CN VIII: Hearing is normal to rubbing fingers CN IX, X: Palate elevates symmetrically. Phonation is normal. CN XI: Head turning and shoulder shrug are intact CN XII: Tongue is midline with normal movements and no atrophy.  MOTOR: There is no pronator drift of out-stretched arms. Muscle bulk and tone are normal. Muscle strength is normal.  REFLEXES: Reflexes are 2+ and symmetric at the biceps, triceps, knees, and ankles. Plantar responses are flexor.  SENSORY: Intact to light touch, pinprick, positional sensation and vibratory sensation are intact in fingers and toes.  COORDINATION: Rapid alternating movements and fine finger movements are intact. There is no dysmetria on finger-to-nose and heel-knee-shin.    GAIT/STANCE: Posture is normal. Gait is steady with normal steps, base, arm swing, and turning. Heel and toe walking are normal. Tandem gait is normal.  Romberg is absent.   DIAGNOSTIC DATA (LABS, IMAGING, TESTING) - I reviewed patient records, labs, notes, testing and imaging myself where available.   ASSESSMENT AND PLAN  Edgar Chang is a 75 y.o. male   Acute confusion on March 03 2016, responded to  anti-epileptic medications consistent with complex partial seizure  Get medical record from [facility] hospital  Repeat EEG   Depression anxiety,  We will taper off Keppra, started lamotrigine, titrating to 100 mg twice a day   Marcial Pacas, M.D. Ph.D.  [facility] [address], [place], [place] [address] Ph: [phone] Fax: [phone]  CC: Referring Provider

## 2016-04-07 NOTE — Telephone Encounter (Signed)
Please call patient that I have ordered repeat EEG

## 2016-04-08 NOTE — Telephone Encounter (Signed)
Please call patient, I was able to review hospital records, he was diagnosed with complex partial seizure, had abnormal EEG, his mentation did had significant improvement after the seizure medications, also supported diagnosis  1, I have ordered repeat EEG, 2, keep the treatment plan as discussed, 3, brain MRI of the brain CD at his next follow-up visit, she does not have to drop it earlier

## 2016-04-08 NOTE — Telephone Encounter (Signed)
Returned call and spoke to pt. Let him know that he could bring MRI CD to his next appt. Also advised that Dr. Terrace ArabiaYan did order a repeat EEG due to a diagnosis of partial seizure. In the meantime, pt agreed to continue current treatment plan w/ anti-seizure meds as instructed. He verbalized understanding and appreciation for call.

## 2016-04-20 ENCOUNTER — Ambulatory Visit (INDEPENDENT_AMBULATORY_CARE_PROVIDER_SITE_OTHER): Payer: Medicare Other | Admitting: Neurology

## 2016-04-20 DIAGNOSIS — R569 Unspecified convulsions: Secondary | ICD-10-CM | POA: Diagnosis not present

## 2016-04-20 DIAGNOSIS — F05 Delirium due to known physiological condition: Secondary | ICD-10-CM

## 2016-04-20 DIAGNOSIS — M5416 Radiculopathy, lumbar region: Secondary | ICD-10-CM

## 2016-04-22 NOTE — Procedures (Signed)
   HISTORY: 75 year old male with history of diabetes, hypertension, hyperlipidemia, hypothyroidism, had confusion episode in October 2017. Previous EEG reported as epileptiform activity of bilateral cerebral, disorganized background.  TECHNIQUE:  16 channel EEG was performed based on standard 10-16 international system. One channel was dedicated to EKG, which has demonstrates normal sinus rhythm of 72 beats per minutes.  Upon awakening, the posterior background activity was well-developed, in alpha range, 10 Hz, reactive to eye opening and closure.  There was no evidence of epileptiform discharge.  Photic stimulation was performed, which induced a symmetric photic driving.  Hyperventilation was performed, there was no abnormality elicit.  No sleep was achieved.  CONCLUSION: This is a  normal awake EEG.  There is no electrodiagnostic evidence of epileptiform discharge.  Levert FeinsteinYijun Eli Pattillo, M.D. Ph.D.  [facility] [address] [place], [place] [address] Phone: [phone] Fax:      [phone]

## 2016-05-05 ENCOUNTER — Other Ambulatory Visit: Payer: Self-pay | Admitting: Neurology

## 2016-05-12 ENCOUNTER — Encounter: Payer: Self-pay | Admitting: Neurology

## 2016-05-12 ENCOUNTER — Ambulatory Visit (INDEPENDENT_AMBULATORY_CARE_PROVIDER_SITE_OTHER): Payer: Medicare Other | Admitting: Neurology

## 2016-05-12 VITALS — BP 141/74 | HR 82 | Ht 71.0 in | Wt 198.0 lb

## 2016-05-12 DIAGNOSIS — I679 Cerebrovascular disease, unspecified: Secondary | ICD-10-CM

## 2016-05-12 DIAGNOSIS — F05 Delirium due to known physiological condition: Secondary | ICD-10-CM | POA: Diagnosis not present

## 2016-05-12 NOTE — Progress Notes (Signed)
PATIENT: Edgar Chang DOB: [birth year]  Chief Complaint  Patient presents with  . Partial Seizure    He is here with his wife, Inez Catalina.  They would like to review his EEG.  He is taking Lamictal 162m, BID.  No further events reported.     HISTORICAL  Edgar Chang a 75years old right-handed male, accompanied by his wife seen in refer by neurosurgeon Dr. JNewman Piesfor presurgical clarification, I also have referring note from his primary care physician Dr. JAdline Potterfrom [facility]  I reviewed and summarized medical record, he had a past medical history of obstructive sleep apnea using CPAP machine, but not using it now, he could not tolerate the machine, also had a history of hypertension, hyperlipidemia, left rotator cuff surgery in the past, left hip bursitis, hypothyroidism, history of thyroidectomy, on supplement, depression anxiety, he has been taking Lexapro 10 mg for 6 months, he denies significant improvement, also had a history of enlarged prostate, status post TURP in 2002  He had a history of chronic low back pain, had previous history of lumbar decompression surgery, with good result, he had recurrent left-sided low back pain, radiating pain to left lower extremity over the past 3 years, I have personally reviewed MRI lumbar in October 2017, there was evidence of 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 JArnoldo Moralewants to have clarification prior to the surgery.  He was admitted to the hospital in October 2017, he had recurrent severe low back pain then, presented to urgent care on March 01 2016, he was given Toradol shots, and the prescription of baclofen 10 mg twice a day, 2 days later, on March 03 2016, he did take his morning dose of baclofen 10 mg, one time have breakfast with his brother and sisters around 8  AM, came back home around 10 AM, when he returned home, he was noted that his eyes were wide open, he complains of not feeling well, went to sleep, shortly afterwards, his wife saw him acting confused, taking off his clothes, came to his wife naked, the confusion remained for few hours, eventually leading to 911 call, hospital admission, he had no recollection of his hospital stay, and rehabilitation stay afterwards.  Per wife, he had MRI of the brain, EEG, was evaluated by neurologist Dr.Huratato Phone [phone] is now taking Keppra 500 mg once a day, there was no recurrent episode, patient stated that his confusion last about 4 days, now his back to his baseline, only has mild intermittent left-sided low back pain. No bowel and bladder incontinence no significant gait abnormality.  [facility], per record, he had history of non-insulin-dependent type 2 diabetes, hypertension, hyperlipidemia, hypothyroidism, and GERD, he was noted to be fidgety and restless, prompting the emergency room visit, MRI of the brain showed no acute intracranial abnormality but does has moderate severe periventricular white matter disease, atrophy, there was a concern of complex partial seizure, versus metabolic encephalopathy, versus side effect from baclofen, EEG showed possible seizure-like activity, he was started on Depakote 500 mg twice a day,   I reviewed laboratory evaluation, normal CMP with creatinine of 0.89, CBC, with hemoglobin of 13,004, chest x-ray showed no acute abnormality, vitamin D was 101:02 folic acid was normal 26, A1c was 6.9, troponin was negative,  LDL 63 cholesterol 139, vitamin D was 19, Depakote level March 07 2016 was 18, RPR was negative for culture was negative,   chest x-ray showed no acute cardiopulmonary disease,   EEG showed disorganized background, and there was reported epileptiform activity of bilateral cerebral, there was also  reported restless legs, 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  UPDATE May 12 2016: I was able to review hospital records in October 2017, he was diagnosed with complex partial seizure, had abnormal EEG, he did have significant improvement after the seizure medications, also supported diagnosis  We have personally reviewed MRI of the brain in October 2017, no acute abnormality, moderate periventricular white matter disease,  He is tolerating lamotrigine 100 mg twice a day, no recurrent seizure activity  REVIEW OF SYSTEMS: Full 14 system review of systems performed and notable only for weakness.   ALLERGIES: No Known Allergies  HOME MEDICATIONS: Current Outpatient Prescriptions  Medication Sig Dispense Refill  . amLODipine (NORVASC) 5 MG tablet daily.    Marland Kitchen aspirin EC 81 MG tablet Take 81 mg by mouth daily.    Marland Kitchen escitalopram (LEXAPRO) 10 MG tablet daily.    . finasteride (PROSCAR) 5 MG tablet daily.    Marland Kitchen glimepiride (AMARYL) 4 MG tablet Take 4 mg by mouth daily before breakfast.    . lamoTRIgine (LAMICTAL) 100 MG tablet Take 1 tablet (100 mg total) by mouth 2 (two) times daily. 60 tablet 11  . levETIRAcetam (KEPPRA) 500 MG tablet daily.    Marland Kitchen lisinopril (PRINIVIL,ZESTRIL) 20 MG tablet Take 20 mg by mouth daily before breakfast.     . metFORMIN (GLUCOPHAGE) 500 MG tablet Take 500 mg by mouth 2 (two) times daily with a meal.    . omeprazole (PRILOSEC) 20 MG capsule Take 20 mg by mouth 2 (two) times daily.    . simvastatin (ZOCOR) 20 MG tablet Take 20 mg by mouth every evening.    Marland Kitchen SYNTHROID 88 MCG tablet Take 1 tablet (88 mcg total) by mouth daily. 30 tablet 3  . Tamsulosin HCl (FLOMAX) 0.4 MG CAPS Take 0.4 mg by mouth daily.    . Vitamin D, Ergocalciferol, (DRISDOL) 50000 units CAPS capsule once a week.     No current facility-administered medications for this visit.     PAST MEDICAL HISTORY: Past Medical History:  Diagnosis Date  . Anxiety   . Arthritis   . Benign prostate  hyperplasia   . Cataract    left eye  . Chronic back pain   . Cough   . Diabetes mellitus without complication ([place])   . GERD (gastroesophageal reflux disease)   . Goiter   . Hyperlipidemia   . Hypertension    sees Dr. Lonia Mad  . Hypothyroidism   . Sleep apnea    uses cpap occass.not used for 2 yrs    PAST SURGICAL HISTORY: Past Surgical History:  Procedure Laterality Date  . CARDIOVASCULAR STRESS TEST     done approx. 3 years ago  . CHOLECYSTECTOMY  1985  . EYE SURGERY     right cataract removal  . HERNIA REPAIR Left   . LUMBAR LAMINECTOMY/DECOMPRESSION MICRODISCECTOMY Left 08/16/2012   Procedure: LUMBAR LAMINECTOMY/DECOMPRESSION MICRODISCECTOMY 1 LEVEL;  Surgeon: Ophelia Charter, MD;  Location: [facility];  Service: Neurosurgery;  Laterality: Left;  LEFT Lumbar Five- Sacral One diskectomy  . PROSTATE SURGERY  2003  . ROTATOR CUFF REPAIR Left 2006  . THYROIDECTOMY N/A 07/24/2012   Procedure:  Total THYROIDECTOMY;  Surgeon: Merlinda Frederick  Gerkin, MD;  Location: [facility];  Service: General;  Laterality: N/A;  Difficult Airway    FAMILY HISTORY: Family History  Problem Relation Age of Onset  . Heart disease Mother   . Multiple myeloma Father     SOCIAL HISTORY:  Social History   Social History  . Marital status: Married    Spouse name: N/A  . Number of children: 2  . Years of education: 12th   Occupational History  . Retired    Social History Main Topics  . Smoking status: Former Smoker    Packs/day: 1.50    Years: 35.00    Types: Cigarettes    Quit date: 07/10/1984  . Smokeless tobacco: Never Used  . Alcohol use No  . Drug use: No  . Sexual activity: Not on file   Other Topics Concern  . Not on file   Social History Narrative   Lives at home with his wife.   Right-handed.   2 cups caffeine per day.     PHYSICAL EXAM   Vitals:   05/12/16 1439  BP: (!) 141/74  Pulse: 82  Weight: 198 lb (89.8 kg)  Height: 5' 11"  (1.803 m)    Not recorded        Body mass index is 27.62 kg/m.  PHYSICAL EXAMNIATION:  Gen: NAD, conversant, well nourised, obese, well groomed                     Cardiovascular: Regular rate rhythm, no peripheral edema, warm, nontender. Eyes: Conjunctivae clear without exudates or hemorrhage Neck: Supple, no carotid bruits. Pulmonary: Clear to auscultation bilaterally   NEUROLOGICAL EXAM:  MENTAL STATUS: Speech:    Speech is normal; fluent and spontaneous with normal comprehension.  Cognition:     Orientation to time, place and person     Normal recent and remote memory     Normal Attention span and concentration     Normal Language, naming, repeating,spontaneous speech     Fund of knowledge   CRANIAL NERVES: CN II: Visual fields are full to confrontation. Fundoscopic exam is normal with sharp discs and no vascular changes. Pupils are round equal and briskly reactive to light. CN III, IV, VI: extraocular movement are normal. No ptosis. CN V: Facial sensation is intact to pinprick in all 3 divisions bilaterally. Corneal responses are intact.  CN VII: Face is symmetric with normal eye closure and smile. CN VIII: Hearing is normal to rubbing fingers CN IX, X: Palate elevates symmetrically. Phonation is normal. CN XI: Head turning and shoulder shrug are intact CN XII: Tongue is midline with normal movements and no atrophy.  MOTOR: There is no pronator drift of out-stretched arms. Muscle bulk and tone are normal. Muscle strength is normal.  REFLEXES: Reflexes are 2+ and symmetric at the biceps, triceps, knees, and ankles. Plantar responses are flexor.  SENSORY: Intact to light touch, pinprick, positional sensation and vibratory sensation are intact in fingers and toes.  COORDINATION: Rapid alternating movements and fine finger movements are intact. There is no dysmetria on finger-to-nose and heel-knee-shin.    GAIT/STANCE: Posture is normal. Gait is steady with normal steps, base, arm swing, and  turning. Heel and toe walking are normal. Tandem gait is normal.  Romberg is absent.   DIAGNOSTIC DATA (LABS, IMAGING, TESTING) - I reviewed patient records, labs, notes, testing and imaging myself where available.   ASSESSMENT AND PLAN  Edgar Chang is a 75 y.o. male   Acute confusion  on March 03 2016, responded to anti-epileptic medications consistent with complex partial seizure  Depression anxiety,  Repeat EEG on April 20 2016 was normal  Keep lamotrigine, titrating to 100 mg twice a day   Marcial Pacas, M.D. Ph.D.  [facility] [address], [place], [place] [address] Ph: [phone] Fax: [phone]  CC: Referring Provider

## 2016-11-10 ENCOUNTER — Ambulatory Visit: Payer: Medicare Other | Admitting: Neurology

## 2016-11-17 ENCOUNTER — Ambulatory Visit: Payer: Medicare Other | Admitting: Neurology

## 2021-08-21 DEATH — deceased
